# Patient Record
Sex: Female | Born: 1970
Health system: Southern US, Community
[De-identification: ages and names within clinical notes are randomized; demographics above are authoritative.]

## PROBLEM LIST (undated history)

## (undated) DIAGNOSIS — Z800 Family history of malignant neoplasm of digestive organs: Secondary | ICD-10-CM

## (undated) DIAGNOSIS — C801 Malignant (primary) neoplasm, unspecified: Secondary | ICD-10-CM

## (undated) DIAGNOSIS — F1910 Other psychoactive substance abuse, uncomplicated: Secondary | ICD-10-CM

## (undated) DIAGNOSIS — Z807 Family history of other malignant neoplasms of lymphoid, hematopoietic and related tissues: Secondary | ICD-10-CM

## (undated) DIAGNOSIS — F32A Depression, unspecified: Secondary | ICD-10-CM

## (undated) DIAGNOSIS — F329 Major depressive disorder, single episode, unspecified: Secondary | ICD-10-CM

## (undated) DIAGNOSIS — Z803 Family history of malignant neoplasm of breast: Secondary | ICD-10-CM

## (undated) HISTORY — DX: Other psychoactive substance abuse, uncomplicated: F19.10

## (undated) HISTORY — DX: Family history of malignant neoplasm of breast: Z80.3

## (undated) HISTORY — DX: Malignant (primary) neoplasm, unspecified: C80.1

## (undated) HISTORY — DX: Family history of other malignant neoplasms of lymphoid, hematopoietic and related tissues: Z80.7

## (undated) HISTORY — DX: Family history of malignant neoplasm of digestive organs: Z80.0

---

## 1998-10-10 ENCOUNTER — Other Ambulatory Visit: Admission: RE | Admit: 1998-10-10 | Discharge: 1998-10-10 | Payer: Self-pay | Admitting: *Deleted

## 1999-02-04 DIAGNOSIS — F1910 Other psychoactive substance abuse, uncomplicated: Secondary | ICD-10-CM

## 1999-02-04 HISTORY — DX: Other psychoactive substance abuse, uncomplicated: F19.10

## 1999-12-25 ENCOUNTER — Encounter: Admission: RE | Admit: 1999-12-25 | Discharge: 1999-12-25 | Payer: Self-pay | Admitting: Family Medicine

## 2001-01-08 ENCOUNTER — Emergency Department (HOSPITAL_COMMUNITY): Admission: EM | Admit: 2001-01-08 | Discharge: 2001-01-08 | Payer: Self-pay | Admitting: Emergency Medicine

## 2004-05-29 ENCOUNTER — Other Ambulatory Visit: Admission: RE | Admit: 2004-05-29 | Discharge: 2004-05-29 | Payer: Self-pay | Admitting: Family Medicine

## 2005-09-23 ENCOUNTER — Other Ambulatory Visit: Admission: RE | Admit: 2005-09-23 | Discharge: 2005-09-23 | Payer: Self-pay | Admitting: Family Medicine

## 2007-05-10 ENCOUNTER — Other Ambulatory Visit: Admission: RE | Admit: 2007-05-10 | Discharge: 2007-05-10 | Payer: Self-pay | Admitting: Family Medicine

## 2008-10-25 ENCOUNTER — Other Ambulatory Visit: Admission: RE | Admit: 2008-10-25 | Discharge: 2008-10-25 | Payer: Self-pay | Admitting: Family Medicine

## 2010-02-06 ENCOUNTER — Other Ambulatory Visit
Admission: RE | Admit: 2010-02-06 | Discharge: 2010-02-06 | Payer: Self-pay | Source: Home / Self Care | Admitting: Family Medicine

## 2011-02-04 HISTORY — PX: NEPHRECTOMY LIVING DONOR: SUR877

## 2011-03-13 ENCOUNTER — Other Ambulatory Visit: Payer: Self-pay | Admitting: *Deleted

## 2011-03-13 DIAGNOSIS — Z1231 Encounter for screening mammogram for malignant neoplasm of breast: Secondary | ICD-10-CM

## 2011-03-31 ENCOUNTER — Ambulatory Visit
Admission: RE | Admit: 2011-03-31 | Discharge: 2011-03-31 | Disposition: A | Discharge status: Home / Self Care | Payer: Self-pay | Source: Ambulatory Visit | Attending: *Deleted | Admitting: *Deleted

## 2011-03-31 DIAGNOSIS — Z1231 Encounter for screening mammogram for malignant neoplasm of breast: Secondary | ICD-10-CM

## 2011-04-02 ENCOUNTER — Other Ambulatory Visit: Payer: Self-pay | Admitting: Family Medicine

## 2011-04-02 ENCOUNTER — Other Ambulatory Visit (HOSPITAL_COMMUNITY)
Admission: RE | Admit: 2011-04-02 | Discharge: 2011-04-02 | Disposition: A | Discharge status: Home / Self Care | Payer: 59 | Source: Ambulatory Visit | Attending: Family Medicine | Admitting: Family Medicine

## 2011-04-02 DIAGNOSIS — Z01419 Encounter for gynecological examination (general) (routine) without abnormal findings: Secondary | ICD-10-CM | POA: Insufficient documentation

## 2011-04-08 ENCOUNTER — Other Ambulatory Visit: Payer: Self-pay | Admitting: Surgery

## 2011-04-08 DIAGNOSIS — Z1231 Encounter for screening mammogram for malignant neoplasm of breast: Secondary | ICD-10-CM

## 2011-04-09 ENCOUNTER — Other Ambulatory Visit: Payer: Self-pay | Admitting: Physician Assistant

## 2012-08-18 ENCOUNTER — Other Ambulatory Visit (HOSPITAL_COMMUNITY)
Admission: RE | Admit: 2012-08-18 | Discharge: 2012-08-18 | Disposition: A | Discharge status: Home / Self Care | Payer: 59 | Source: Ambulatory Visit | Attending: Family Medicine | Admitting: Family Medicine

## 2012-08-18 ENCOUNTER — Other Ambulatory Visit: Payer: Self-pay | Admitting: Family Medicine

## 2012-08-18 DIAGNOSIS — Z01419 Encounter for gynecological examination (general) (routine) without abnormal findings: Secondary | ICD-10-CM | POA: Insufficient documentation

## 2013-02-03 HISTORY — PX: NEPHRECTOMY: SHX65

## 2016-02-04 DIAGNOSIS — C8190 Hodgkin lymphoma, unspecified, unspecified site: Secondary | ICD-10-CM

## 2016-02-04 HISTORY — DX: Hodgkin lymphoma, unspecified, unspecified site: C81.90

## 2016-04-16 DIAGNOSIS — L918 Other hypertrophic disorders of the skin: Secondary | ICD-10-CM | POA: Diagnosis not present

## 2016-06-24 DIAGNOSIS — R221 Localized swelling, mass and lump, neck: Secondary | ICD-10-CM | POA: Diagnosis not present

## 2016-06-24 DIAGNOSIS — Z1231 Encounter for screening mammogram for malignant neoplasm of breast: Secondary | ICD-10-CM | POA: Diagnosis not present

## 2016-06-25 ENCOUNTER — Other Ambulatory Visit: Payer: Self-pay | Admitting: Family Medicine

## 2016-06-25 DIAGNOSIS — R221 Localized swelling, mass and lump, neck: Secondary | ICD-10-CM

## 2016-06-26 ENCOUNTER — Other Ambulatory Visit: Payer: Self-pay | Admitting: Family Medicine

## 2016-06-26 DIAGNOSIS — R221 Localized swelling, mass and lump, neck: Secondary | ICD-10-CM

## 2016-07-03 ENCOUNTER — Other Ambulatory Visit: Payer: Self-pay

## 2016-07-07 ENCOUNTER — Ambulatory Visit
Admission: RE | Admit: 2016-07-07 | Discharge: 2016-07-07 | Disposition: A | Discharge status: Home / Self Care | Payer: 59 | Source: Ambulatory Visit | Attending: Family Medicine | Admitting: Family Medicine

## 2016-07-07 DIAGNOSIS — R221 Localized swelling, mass and lump, neck: Secondary | ICD-10-CM

## 2016-07-07 DIAGNOSIS — Z0389 Encounter for observation for other suspected diseases and conditions ruled out: Secondary | ICD-10-CM | POA: Diagnosis not present

## 2016-07-14 ENCOUNTER — Other Ambulatory Visit (HOSPITAL_COMMUNITY): Payer: Self-pay | Admitting: Family Medicine

## 2016-07-14 DIAGNOSIS — R590 Localized enlarged lymph nodes: Secondary | ICD-10-CM

## 2016-07-16 ENCOUNTER — Other Ambulatory Visit: Payer: Self-pay | Admitting: Physician Assistant

## 2016-07-18 ENCOUNTER — Other Ambulatory Visit: Payer: Self-pay | Admitting: Radiology

## 2016-07-18 ENCOUNTER — Other Ambulatory Visit: Payer: Self-pay | Admitting: General Surgery

## 2016-07-18 ENCOUNTER — Ambulatory Visit (HOSPITAL_COMMUNITY): Payer: 59

## 2016-07-21 ENCOUNTER — Ambulatory Visit (HOSPITAL_COMMUNITY)
Admission: RE | Admit: 2016-07-21 | Discharge: 2016-07-21 | Disposition: A | Discharge status: Home / Self Care | Payer: 59 | Source: Ambulatory Visit | Attending: Family Medicine | Admitting: Family Medicine

## 2016-07-21 ENCOUNTER — Encounter (HOSPITAL_COMMUNITY): Payer: Self-pay

## 2016-07-21 DIAGNOSIS — C8170 Other classical Hodgkin lymphoma, unspecified site: Secondary | ICD-10-CM | POA: Diagnosis not present

## 2016-07-21 DIAGNOSIS — R590 Localized enlarged lymph nodes: Secondary | ICD-10-CM

## 2016-07-21 DIAGNOSIS — Z87891 Personal history of nicotine dependence: Secondary | ICD-10-CM | POA: Diagnosis not present

## 2016-07-21 DIAGNOSIS — C8190 Hodgkin lymphoma, unspecified, unspecified site: Secondary | ICD-10-CM | POA: Diagnosis not present

## 2016-07-21 DIAGNOSIS — F329 Major depressive disorder, single episode, unspecified: Secondary | ICD-10-CM | POA: Insufficient documentation

## 2016-07-21 HISTORY — DX: Major depressive disorder, single episode, unspecified: F32.9

## 2016-07-21 HISTORY — DX: Depression, unspecified: F32.A

## 2016-07-21 LAB — CBC
HCT: 38 % (ref 36.0–46.0)
HEMOGLOBIN: 12.3 g/dL (ref 12.0–15.0)
MCH: 31.5 pg (ref 26.0–34.0)
MCHC: 32.4 g/dL (ref 30.0–36.0)
MCV: 97.4 fL (ref 78.0–100.0)
Platelets: 300 10*3/uL (ref 150–400)
RBC: 3.9 MIL/uL (ref 3.87–5.11)
RDW: 12.9 % (ref 11.5–15.5)
WBC: 7.3 10*3/uL (ref 4.0–10.5)

## 2016-07-21 LAB — PREGNANCY, URINE: PREG TEST UR: NEGATIVE

## 2016-07-21 MED ORDER — FENTANYL CITRATE (PF) 100 MCG/2ML IJ SOLN
INTRAMUSCULAR | Status: AC
  Filled 2016-07-21: qty 2

## 2016-07-21 MED ORDER — FENTANYL CITRATE (PF) 100 MCG/2ML IJ SOLN
INTRAMUSCULAR | Status: AC | PRN
  Administered 2016-07-21: 50 ug via INTRAVENOUS
  Administered 2016-07-21: 25 ug via INTRAVENOUS

## 2016-07-21 MED ORDER — MIDAZOLAM HCL 2 MG/2ML IJ SOLN
INTRAMUSCULAR | Status: AC
  Filled 2016-07-21: qty 2

## 2016-07-21 MED ORDER — SODIUM CHLORIDE 0.9 % IV SOLN: INTRAVENOUS | Status: DC

## 2016-07-21 MED ORDER — SODIUM CHLORIDE 0.9 % IV SOLN
INTRAVENOUS | Status: AC | PRN
  Administered 2016-07-21: 10 mL/h via INTRAVENOUS

## 2016-07-21 MED ORDER — MIDAZOLAM HCL 2 MG/2ML IJ SOLN
INTRAMUSCULAR | Status: AC | PRN
  Administered 2016-07-21: 1 mg via INTRAVENOUS
  Administered 2016-07-21 (×2): 0.5 mg via INTRAVENOUS

## 2016-07-21 MED ORDER — LIDOCAINE HCL (PF) 1 % IJ SOLN
INTRAMUSCULAR | Status: AC
  Filled 2016-07-21: qty 30

## 2016-07-21 NOTE — Discharge Instructions (Addendum)
Moderate Conscious Sedation, Adult, Care After °These instructions provide you with information about caring for yourself after your procedure. Your health care provider may also give you more specific instructions. Your treatment has been planned according to current medical practices, but problems sometimes occur. Call your health care provider if you have any problems or questions after your procedure. °What can I expect after the procedure? °After your procedure, it is common: °· To feel sleepy for several hours. °· To feel clumsy and have poor balance for several hours. °· To have poor judgment for several hours. °· To vomit if you eat too soon. ° °Follow these instructions at home: °Needle Biopsy, Care After °These instructions give you information about caring for yourself after your procedure. Your doctor may also give you more specific instructions. Call your doctor if you have any problems or questions after your procedure. °Follow these instructions at home: °· Rest as told by your doctor. °· Take medicines only as told by your doctor. °· There are many different ways to close and cover the biopsy site, including stitches (sutures), skin glue, and adhesive strips. Follow instructions from your doctor about: °? How to take care of your biopsy site. °? When and how you should change your bandage (dressing). °? When you should remove your dressing. °? Removing whatever was used to close your biopsy site. °· Check your biopsy site every day for signs of infection. Watch for: °? Redness, swelling, or pain. °? Fluid, blood, or pus. °Contact a doctor if: °· You have a fever. °· You have redness, swelling, or pain at the biopsy site, and it lasts longer than a few days. °· You have fluid, blood, or pus coming from the biopsy site. °· You feel sick to your stomach (nauseous). °· You throw up (vomit). °Get help right away if: °· You are short of breath. °· You have trouble breathing. °· Your chest hurts. °· You  feel dizzy or you pass out (faint). °· You have bleeding that does not stop with pressure or a bandage. °· You cough up blood. °· Your belly (abdomen) hurts. °This information is not intended to replace advice given to you by your health care provider. Make sure you discuss any questions you have with your health care provider. °Document Released: 01/03/2008 Document Revised: 06/28/2015 Document Reviewed: 01/16/2014 °Elsevier Interactive Patient Education © 2018 Elsevier Inc. ° °For at least 24 hours after the procedure: ° °· Do not: °? Participate in activities where you could fall or become injured. °? Drive. °? Use heavy machinery. °? Drink alcohol. °? Take sleeping pills or medicines that cause drowsiness. °? Make important decisions or sign legal documents. °? Take care of children on your own. °· Rest. °Eating and drinking °· Follow the diet recommended by your health care provider. °· If you vomit: °? Drink water, juice, or soup when you can drink without vomiting. °? Make sure you have little or no nausea before eating solid foods. °General instructions °· Have a responsible adult stay with you until you are awake and alert. °· Take over-the-counter and prescription medicines only as told by your health care provider. °· If you smoke, do not smoke without supervision. °· Keep all follow-up visits as told by your health care provider. This is important. °Contact a health care provider if: °· You keep feeling nauseous or you keep vomiting. °· You feel light-headed. °· You develop a rash. °· You have a fever. °Get help right away if: °·   you discuss any questions you have with your health care provider. °Document Released: 11/10/2012 Document Revised: 06/25/2015 Document Reviewed: 05/12/2015 °Elsevier Interactive Patient Education © 2018 Elsevier Inc. ° ° °

## 2016-07-21 NOTE — Procedures (Signed)
US guided L neck LN Bx 18 g core times two EBL 0 Comp 0

## 2016-07-21 NOTE — H&P (Signed)
Chief Complaint: Patient was seen in consultation today for lymph node biopsy  Referring Physician(s): Sun,Vyvyan  Supervising Physician: Marybelle Killings  Patient Status: [facility] - Out-pt  History of Present Illness: LADAJAH Mccann is a 46 y.o. female with past medical history of depression who noted an enlarged lymph node on the left side of her neck.  IR consulted at the request of Dr. Donald Prose for a left neck lymph node biopsy.   Case was reviewed by Dr. Kathlene Cote who approves patient for procedure.   She presents for procedure today.  She has been NPO.  She does not take blood thinners.  She has been in her usual state of health.   Past Medical History:  Diagnosis Date  . Depression     Past Surgical History:  Procedure Laterality Date  . NEPHRECTOMY Left 2015    Allergies: Patient has no known allergies.  Medications: Prior to Admission medications   Medication Sig Start Date End Date Taking? Authorizing Provider  buPROPion (WELLBUTRIN XL) 300 MG 24 hr tablet Take 1 tablet by mouth daily. 02/15/10  Yes [provider]     History reviewed. No pertinent family history.  Social History   Social History  . Marital status: Married    Spouse name: N/A  . Number of children: N/A  . Years of education: N/A   Social History Main Topics  . Smoking status: Former Smoker    Types: Cigarettes  . Smokeless tobacco: Never Used     Comment: quit 17 yrs ago  . Alcohol use No  . Drug use: No  . Sexual activity: Not Asked   Other Topics Concern  . None   Social History Narrative  . None    Review of Systems  Constitutional: Negative for fatigue and fever.  Respiratory: Negative for cough and shortness of breath.   Cardiovascular: Negative for chest pain.  Psychiatric/Behavioral: Negative for behavioral problems and confusion.    Vital Signs: BP 119/73   Pulse 68   Temp 98.4 F (36.9 C) (Oral)   Resp 16   Ht 5\' 9"  (1.753 m)   Wt 205 lb (93 kg)    LMP 07/21/2016   SpO2 97%   BMI 30.27 kg/m   Physical Exam  Constitutional: She is oriented to person, place, and time. She appears well-developed.  Cardiovascular: Normal rate, regular rhythm and normal heart sounds.   Pulmonary/Chest: Effort normal and breath sounds normal.  Lymphadenopathy:    She has cervical adenopathy (palpable lymph node on the left neck).  Neurological: She is alert and oriented to person, place, and time.  Skin: Skin is warm and dry.  Psychiatric: She has a normal mood and affect. Her behavior is normal. Judgment and thought content normal.  Nursing note and vitals reviewed.   Mallampati Score:  MD Evaluation Airway: WNL Heart: WNL Abdomen: WNL Chest/ Lungs: WNL ASA  Classification: 3 Mallampati/Airway Score: Two  Imaging: US Soft Tissue Head And Neck  Result Date: 07/07/2016 CLINICAL DATA:  Palpable nodule left posterior neck/ supraclavicular region EXAM: ULTRASOUND OF HEAD/NECK SOFT TISSUES TECHNIQUE: Ultrasound examination of the head and neck soft tissues was performed in the area of clinical concern. COMPARISON:  None available FINDINGS: Small palpable nodule correlates with a superficial hypoechoic oval mass measuring 1.9 x 0.8 x 1.4 cm. No distinct fatty hilum. There is associated vascularity. Appearance is compatible with a superficial lymph node with loss of architecture. This is suspicious for a lymphoproliferative process. Consider  further evaluation with neck CT with contrast versus ultrasound biopsy. IMPRESSION: Left posterior neck palpable abnormality correlates with an abnormal superficial lymph node as above. See above comment and recommendation. Electronically Signed   By: Jerilynn Mages.  Shick M.D.   On: 07/07/2016 13:41    Labs:  CBC:  Recent Labs  07/21/16 0615  WBC 7.3  HGB 12.3  HCT 38.0  PLT 300    COAGS: No results for input(s): INR, APTT in the last 8760 hours.  BMP: No results for input(s): NA, K, CL, CO2, GLUCOSE, BUN,  CALCIUM, CREATININE, GFRNONAA, GFRAA in the last 8760 hours.  Invalid input(s): CMP  LIVER FUNCTION TESTS: No results for input(s): BILITOT, AST, ALT, ALKPHOS, PROT, ALBUMIN in the last 8760 hours.  TUMOR MARKERS: No results for input(s): AFPTM, CEA, CA199, CHROMGRNA in the last 8760 hours.  Assessment and Plan: Patient with past medical history of depression presents with enlarged left neck lymph node.  Request is made for biopsy by Dr. Donald Prose.  Patient has been approved for procedure and presents to department today in her usual state of health.  She has been NPO.  She does not take blood thinners.  Risks and benefits discussed with the patient including, but not limited to bleeding, infection, damage to adjacent structures or low yield requiring additional tests. All of the patient's questions were answered, patient is agreeable to proceed. Consent signed and in chart.  Thank you for this interesting consult.  I greatly enjoyed meeting Rhonda Mccann and look forward to participating in their care.  A copy of this report was sent to the requesting provider on this date.  Electronically Signed: Docia Barrier, PA 07/21/2016, 8:02 AM   I spent a total of  30 Minutes   in face to face in clinical consultation, greater than 50% of which was counseling/coordinating care for enlarged lymph node

## 2016-07-29 ENCOUNTER — Ambulatory Visit (HOSPITAL_BASED_OUTPATIENT_CLINIC_OR_DEPARTMENT_OTHER): Payer: 59

## 2016-07-29 ENCOUNTER — Encounter: Payer: Self-pay | Admitting: Hematology

## 2016-07-29 ENCOUNTER — Ambulatory Visit (HOSPITAL_BASED_OUTPATIENT_CLINIC_OR_DEPARTMENT_OTHER): Payer: 59 | Admitting: Hematology

## 2016-07-29 ENCOUNTER — Telehealth: Payer: Self-pay | Admitting: Hematology

## 2016-07-29 VITALS — BP 124/74 | HR 72 | Temp 98.3°F | Resp 18 | Ht 69.0 in | Wt 209.9 lb

## 2016-07-29 DIAGNOSIS — C8110 Nodular sclerosis classical Hodgkin lymphoma, unspecified site: Secondary | ICD-10-CM | POA: Diagnosis not present

## 2016-07-29 DIAGNOSIS — Z905 Acquired absence of kidney: Secondary | ICD-10-CM | POA: Diagnosis not present

## 2016-07-29 LAB — CBC & DIFF AND RETIC
BASO%: 0.2 % (ref 0.0–2.0)
Basophils Absolute: 0 10*3/uL (ref 0.0–0.1)
EOS ABS: 0.1 10*3/uL (ref 0.0–0.5)
EOS%: 1.1 % (ref 0.0–7.0)
HCT: 41.8 % (ref 34.8–46.6)
HGB: 13.7 g/dL (ref 11.6–15.9)
Immature Retic Fract: 4.5 % (ref 1.60–10.00)
LYMPH#: 1.9 10*3/uL (ref 0.9–3.3)
LYMPH%: 22.9 % (ref 14.0–49.7)
MCH: 31.9 pg (ref 25.1–34.0)
MCHC: 32.8 g/dL (ref 31.5–36.0)
MCV: 97.4 fL (ref 79.5–101.0)
MONO#: 0.4 10*3/uL (ref 0.1–0.9)
MONO%: 5.2 % (ref 0.0–14.0)
NEUT#: 5.8 10*3/uL (ref 1.5–6.5)
NEUT%: 70.6 % (ref 38.4–76.8)
Platelets: 338 10*3/uL (ref 145–400)
RBC: 4.29 10*6/uL (ref 3.70–5.45)
RDW: 12.7 % (ref 11.2–14.5)
RETIC %: 1.94 % (ref 0.70–2.10)
RETIC CT ABS: 83.23 10*3/uL (ref 33.70–90.70)
WBC: 8.2 10*3/uL (ref 3.9–10.3)

## 2016-07-29 LAB — COMPREHENSIVE METABOLIC PANEL
ALT: 21 U/L (ref 0–55)
AST: 14 U/L (ref 5–34)
Albumin: 4.2 g/dL (ref 3.5–5.0)
Alkaline Phosphatase: 105 U/L (ref 40–150)
Anion Gap: 12 mEq/L — ABNORMAL HIGH (ref 3–11)
BUN: 8.6 mg/dL (ref 7.0–26.0)
CALCIUM: 9.7 mg/dL (ref 8.4–10.4)
CHLORIDE: 105 meq/L (ref 98–109)
CO2: 22 mEq/L (ref 22–29)
CREATININE: 1.1 mg/dL (ref 0.6–1.1)
EGFR: 60 mL/min/{1.73_m2} — ABNORMAL LOW (ref >90)
GLUCOSE: 88 mg/dL (ref 70–140)
Potassium: 3.6 mEq/L (ref 3.5–5.1)
SODIUM: 140 meq/L (ref 136–145)
Total Bilirubin: 0.38 mg/dL (ref 0.20–1.20)
Total Protein: 8.1 g/dL (ref 6.4–8.3)

## 2016-07-29 LAB — LACTATE DEHYDROGENASE: LDH: 166 U/L (ref 125–245)

## 2016-07-29 NOTE — Telephone Encounter (Signed)
Spoke with patient re new patient appointment with Dr. Irene Limbo for today at 12 noon. Patient will arrive 15-30 minutes early for check in.

## 2016-07-29 NOTE — Progress Notes (Signed)
[facility]    HEMATOLOGY/ONCOLOGY CONSULTATION NOTE  Date of Service: 07/29/2016  Patient Care Team: Donald Prose, MD as PCP - General (Family Medicine)  CHIEF COMPLAINTS/PURPOSE OF CONSULTATION:  Newly diagnosed Hodgkins lymphoma  HISTORY OF PRESENTING ILLNESS:   Rhonda Mccann is a wonderful 46 y.o. female who has been referred to [place] by Dr .Donald Prose, MD for evaluation and management of newly diagnosed Hodgkins Lymphoma.  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  Patient first reported noticing a lump in the left side of her neck in January 2018. She noted some mild progressive increase in this lump which appeared more prominent and she reported it to her primary care physician. Patient had an ultrasound of her neck on 07/07/2016 which showed a 1.9 x 0.8 x 1.4 cm lymph node with no distinct fatty hilum. This was subsequently biopsied under ultrasound guidance on 07/21/2016 and findings are consistent with classical Hodgkin's lymphoma nodular sclerosis subtype.  Patient notes no other overtly palpable lymphadenopathy noted. No fevers no chills no night sweats no unexpected weight loss. She reports no lethargy no anorexia. Overall feels quite well.  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   MEDICAL HISTORY:  Past Medical History:  Diagnosis Date  . Depression   EX alcohol abuse sober for 17 years  Donated left kidney in 2013 (single kidney) Scoliosis GERD  SURGICAL HISTORY: Past Surgical History:  Procedure Laterality Date  . NEPHRECTOMY Left 2015  Breast Augmentation Surgery in 1994 (saline implants)  SOCIAL HISTORY: Social History   Social History  . Marital status: Married    Spouse name: N/A  . Number of children: N/A  . Years of education:  N/A   Occupational History  . Not on file.   Social History Main Topics  . Smoking status: Former Smoker    Types: Cigarettes  . Smokeless tobacco: Never Used     Comment: quit 17 yrs ago  . Alcohol use No  . Drug use: No  . Sexual activity: Not on file   Other Topics Concern  . Not on file   Social History Narrative  . No narrative on file  Works as a Production manager   FAMILY HISTORY: Paternal aunt had breast cancer Paternal grandfather had lymphoma  ALLERGIES:  has No Known Allergies.  MEDICATIONS:  Current Outpatient Prescriptions  Medication Sig Dispense Refill  . buPROPion (WELLBUTRIN XL) 300 MG 24 hr tablet Take 1 tablet by mouth daily.    Marland Kitchen ibuprofen (ADVIL,MOTRIN) 800 MG tablet Take 800 mg by mouth every 8 (eight) hours as needed.    . lidocaine-prilocaine (EMLA) cream Apply 1 application topically as needed. 30 g 0  . ondansetron (ZOFRAN) 8 MG tablet Take 1 tablet (8 mg total) by mouth every 8 (eight) hours as needed for nausea or vomiting. 20 tablet 0  . prochlorperazine (COMPAZINE) 10 MG tablet Take 1 tablet (10 mg total) by mouth every 6 (six) hours as needed for nausea or vomiting. 30 tablet 0   No current facility-administered medications for this visit.     REVIEW OF SYSTEMS:    10 Point review of Systems was done is negative except as noted above.  PHYSICAL EXAMINATION: ECOG PERFORMANCE STATUS: 0 - Asymptomatic  . Vitals:   07/29/16 1221  BP: 124/74  Pulse: 72  Resp: 18  Temp: 98.3 F (36.8 C)   Filed Weights   07/29/16 1221  Weight: 209 lb 14.4 oz (95.2 kg)   .Body mass index is 31 kg/m.  GENERAL:alert, in no acute distress and comfortable SKIN: no acute rashes, no significant lesions EYES: conjunctiva are pink and non-injected, sclera anicteric OROPHARYNX: MMM, no exudates, no oropharyngeal erythema or ulceration NECK: supple, no JVD LYMPH:  no palpable lymphadenopathy in the cervical, axillary or inguinal regions LUNGS: clear to  auscultation b/l with normal respiratory effort HEART: regular rate & rhythm ABDOMEN:  normoactive bowel sounds , non tender, not distended. Extremity: no pedal edema PSYCH: alert & oriented x 3 with fluent speech NEURO: no focal motor/sensory deficits  LABORATORY DATA:  I have reviewed the data as listed  . CBC Latest Ref Rng & Units 07/21/2016  WBC 4.0 - 10.5 K/uL 7.3  Hemoglobin 12.0 - 15.0 g/dL 12.3  Hematocrit 36.0 - 46.0 % 38.0  Platelets 150 - 400 K/uL 300   Component     Latest Ref Rng & Units 07/29/2016  WBC     3.9 - 10.3 10e3/uL 8.2  NEUT#     1.5 - 6.5 10e3/uL 5.8  Hemoglobin     11.6 - 15.9 g/dL 13.7  HCT     34.8 - 46.6 % 41.8  Platelets     145 - 400 10e3/uL 338  MCV     79.5 - 101.0 fL 97.4  MCH     25.1 - 34.0 pg 31.9  MCHC     31.5 - 36.0 g/dL 32.8  RBC     3.70 - 5.45 10e6/uL 4.29  RDW     11.2 - 14.5 % 12.7  lymph#     0.9 - 3.3 10e3/uL 1.9  MONO#     0.1 - 0.9 10e3/uL 0.4  Eosinophils Absolute     0.0 - 0.5 10e3/uL 0.1  Basophils Absolute     0.0 - 0.1 10e3/uL 0.0  NEUT%     38.4 - 76.8 % 70.6  LYMPH%     14.0 - 49.7 % 22.9  MONO%     0.0 - 14.0 % 5.2  EOS%     0.0 - 7.0 % 1.1  BASO%     0.0 - 2.0 % 0.2  Retic %     0.70 - 2.10 % 1.94  Retic Ct Abs     33.70 - 90.70 10e3/uL 83.23  Immature Retic Fract     1.60 - 10.00 % 4.50  Sodium     136 - 145 mEq/L 140  Potassium     3.5 - 5.1 mEq/L 3.6  Chloride     98 - 109 mEq/L 105  CO2     22 - 29 mEq/L 22  Glucose     70 - 140 mg/dl 88  BUN     7.0 - 26.0 mg/dL 8.6  Creatinine     0.6 - 1.1 mg/dL 1.1  Total Bilirubin     0.20 - 1.20 mg/dL 0.38  Alkaline Phosphatase     40 - 150 U/L 105  AST     5 - 34 U/L 14  ALT     0 - 55 U/L 21  Total Protein     6.4 - 8.3 g/dL 8.1  Albumin     3.5 - 5.0 g/dL 4.2  Calcium     8.4 - 10.4 mg/dL 9.7  Anion gap  3 - 11 mEq/L 12 (H)  EGFR     >90 ml/min/1.73 m2 60 (L)  Sed Rate     0 - 32 mm/hr 18  LDH     125 - 245 U/L 166   HIV Screen 4th Generation wRfx     Non Reactive Non Reactive  Hep C Virus Ab     0.0 - 0.9 s/co ratio <0.1  Hepatitis B Surface Ag     Negative Negative  Hep B Core Ab, Tot     Negative Negative      RADIOGRAPHIC STUDIES: I have personally reviewed the radiological images as listed and agreed with the findings in the report. US Soft Tissue Head And Neck  Result Date: 07/07/2016 CLINICAL DATA:  Palpable nodule left posterior neck/ supraclavicular region EXAM: ULTRASOUND OF HEAD/NECK SOFT TISSUES TECHNIQUE: Ultrasound examination of the head and neck soft tissues was performed in the area of clinical concern. COMPARISON:  None available FINDINGS: Small palpable nodule correlates with a superficial hypoechoic oval mass measuring 1.9 x 0.8 x 1.4 cm. No distinct fatty hilum. There is associated vascularity. Appearance is compatible with a superficial lymph node with loss of architecture. This is suspicious for a lymphoproliferative process. Consider further evaluation with neck CT with contrast versus ultrasound biopsy. IMPRESSION: Left posterior neck palpable abnormality correlates with an abnormal superficial lymph node as above. See above comment and recommendation. Electronically Signed   By: Jerilynn Mages.  Shick M.D.   On: 07/07/2016 13:41   US Biopsy  Result Date: 07/21/2016 INDICATION: Enlarged left neck lymph node EXAM: ULTRASOUND-GUIDED BIOPSY LEFT NECK LYMPH NODE.  CORE. MEDICATIONS: None. ANESTHESIA/SEDATION: Fentanyl 75 mcg IV; Versed 2 mg IV Moderate Sedation Time:  10 The patient was continuously monitored during the procedure by the interventional radiology nurse under my direct supervision. FLUOROSCOPY TIME:  None COMPLICATIONS: None immediate. PROCEDURE: Informed written consent was obtained from the patient after a thorough discussion of the procedural risks, benefits and alternatives. All questions were addressed. Maximal Sterile Barrier Technique was utilized including caps, mask, sterile  gowns, sterile gloves, sterile drape, hand hygiene and skin antiseptic. A timeout was performed prior to the initiation of the procedure. The left neck was prepped with ChloraPrep in a sterile fashion, and a sterile drape was applied covering the operative field. A sterile gown and sterile gloves were used for the procedure. Under sonographic guidance, 2 18 gauge core biopsies of the left neck lymph node were obtained. Final imaging was performed. Patient tolerated the procedure well without complication. Vital sign monitoring by nursing staff during the procedure will continue as patient is in the special procedures unit for post procedure observation. FINDINGS: The images document guide needle placement within the left neck lymph node. Post biopsy images demonstrate no hemorrhage. IMPRESSION: Successful ultrasound-guided core biopsy of a left neck lymph node. Electronically Signed   By: Marybelle Killings M.D.   On: 07/21/2016 09:51    ASSESSMENT & PLAN:   46 yo very pleasant caucasian female with   1) Newly diagnosed Classical Hodgkins Lymphoma -Nodular Sclerosis Subtype No constitutional symptoms. Unknown stage at this time. Sed rate 18 (WNL) Plan -Continue diagnosis of classical Hodgkin lymphoma, workup, prognosis, treatment rationale and outcomes were discussed in detail with the patient . -She had large number of questions which were discussed in details -We discussed the plan of treatment and the fact that the specifics will be determined by staging  Labs today PET/CT in 5-7 days ECHO in 5-7 days PFT's in  5-7 days Port-a-cath placement in 7-10 days Chemo-counseling in 7-10 days for ABVD RTC with Dr Irene Limbo with labs on 08/19/2016 We shall plan to start ABVD 08/19/2016. We discussed that the total number of cycles and specific will depending on staging.  2) Single kidney - patient donated left kidney in 2013 -avoid nephrotoxic medications -will get 24h urine creatinine clearance for accurate  assessment of residual renal function from chemotherapy dosing standpoint.  All of the patients questions were answered with apparent satisfaction. The patient knows to call the clinic with any problems, questions or concerns.  I spent 60 minutes counseling the patient face to face. The total time spent in the appointment was 80 minutes and more than 50% was on counseling and direct patient cares.    Sullivan Lone MD [place] AAHIVMS [facility] Hematology/Oncology Physician [facility]  (Office):       [phone] (Work cell):  [phone] (Fax):           [phone]  07/29/2016 12:27 PM

## 2016-07-29 NOTE — Patient Instructions (Signed)
Thank you for choosing [facility] to provide your oncology and hematology care.  To afford each patient quality time with our providers, please arrive 30 minutes before your scheduled appointment time.  If you arrive late for your appointment, you may be asked to reschedule.  We strive to give you quality time with our providers, and arriving late affects you and other patients whose appointments are after yours.  If you are a no show for multiple scheduled visits, you may be dismissed from the clinic at the providers discretion.   Again, thank you for choosing [facility], our hope is that these requests will decrease the amount of time that you wait before being seen by our physicians.  ______________________________________________________________________ Should you have questions after your visit to the [facility], please contact our office at [phone] between the hours of 8:30 and 4:30 p.m.    Voicemails left after 4:30p.m will not be returned until the following business day.   For prescription refill requests, please have your pharmacy contact us directly.  Please also try to allow 48 hours for prescription requests.   Please contact the scheduling department for questions regarding scheduling.  For scheduling of procedures such as PET scans, CT scans, MRI, Ultrasound, etc please contact central scheduling at [phone].   Resources For Cancer Patients and Caregivers:  American Cancer Society:  800-227-2345  Can help patients locate various types of support and financial assistance Cancer Care: 1-800-813-HOPE (4673) Provides financial assistance, online support groups, medication/co-pay assistance.   [place] DSS:  336-641-3447 Where to apply for food stamps, Medicaid, and utility assistance Medicare Rights Center: 800-333-4114 Helps people with Medicare understand their rights and benefits, navigate the Medicare system, and secure the  quality healthcare they deserve SCAT: 336-333-6589 [place] [employer]'s shared-ride transportation service for eligible riders who have a disability that prevents them from riding the fixed route bus.   For additional information on assistance programs please contact our social worker:   Grier Hock/Abigail Elmore:  [phone] 

## 2016-07-29 NOTE — Telephone Encounter (Signed)
Scheduled appt per 6/26 los Gave patient AVS and calender per los. Central radiology to contact patient with pet and echo appt.

## 2016-07-30 ENCOUNTER — Encounter: Payer: Self-pay | Admitting: *Deleted

## 2016-07-30 LAB — SEDIMENTATION RATE: Sedimentation Rate-Westergren: 18 mm/hr (ref 0–32)

## 2016-07-30 LAB — HEPATITIS C ANTIBODY: Hep C Virus Ab: <0.1 s/co ratio (ref 0.0–0.9)

## 2016-07-30 LAB — HIV ANTIBODY (ROUTINE TESTING W REFLEX): HIV Screen 4th Generation wRfx: NONREACTIVE

## 2016-07-30 LAB — HEPATITIS B SURFACE ANTIGEN: HBsAg Screen: NEGATIVE

## 2016-07-30 LAB — HEPATITIS B CORE ANTIBODY, TOTAL: HEP B C TOTAL AB: NEGATIVE

## 2016-07-31 ENCOUNTER — Other Ambulatory Visit: Payer: 59

## 2016-07-31 ENCOUNTER — Encounter: Payer: Self-pay | Admitting: *Deleted

## 2016-08-04 ENCOUNTER — Other Ambulatory Visit: Payer: Self-pay | Admitting: Radiology

## 2016-08-04 ENCOUNTER — Ambulatory Visit (HOSPITAL_COMMUNITY)
Admission: RE | Admit: 2016-08-04 | Discharge: 2016-08-04 | Disposition: A | Discharge status: Home / Self Care | Payer: 59 | Source: Ambulatory Visit | Attending: Hematology | Admitting: Hematology

## 2016-08-04 DIAGNOSIS — C8110 Nodular sclerosis classical Hodgkin lymphoma, unspecified site: Secondary | ICD-10-CM | POA: Insufficient documentation

## 2016-08-04 LAB — PULMONARY FUNCTION TEST
DL/VA % PRED: 79 %
DL/VA: 4.22 ml/min/mmHg/L
DLCO UNC % PRED: 70 %
DLCO UNC: 21.88 ml/min/mmHg
FEF 25-75 Pre: 4.32 L/sec
FEF2575-%Pred-Pre: 133 %
FEV1-%Pred-Pre: 98 %
FEV1-Pre: 3.36 L
FEV1FVC-%PRED-PRE: 107 %
FEV6-%PRED-PRE: 92 %
FEV6-PRE: 3.82 L
FEV6FVC-%Pred-Pre: 102 %
FVC-%Pred-Pre: 90 %
FVC-Pre: 3.86 L
PRE FEV1/FVC RATIO: 87 %
PRE FEV6/FVC RATIO: 100 %
RV % PRED: 71 %
RV: 1.39 L
TLC % PRED: 91 %
TLC: 5.31 L

## 2016-08-05 ENCOUNTER — Ambulatory Visit: Payer: 59

## 2016-08-05 ENCOUNTER — Ambulatory Visit (HOSPITAL_COMMUNITY)
Admission: RE | Admit: 2016-08-05 | Discharge: 2016-08-05 | Disposition: A | Discharge status: Home / Self Care | Payer: 59 | Source: Ambulatory Visit | Attending: Hematology | Admitting: Hematology

## 2016-08-05 ENCOUNTER — Encounter (HOSPITAL_COMMUNITY): Payer: Self-pay

## 2016-08-05 ENCOUNTER — Other Ambulatory Visit: Payer: Self-pay | Admitting: Hematology

## 2016-08-05 DIAGNOSIS — C8191 Hodgkin lymphoma, unspecified, lymph nodes of head, face, and neck: Secondary | ICD-10-CM | POA: Diagnosis present

## 2016-08-05 DIAGNOSIS — C8110 Nodular sclerosis classical Hodgkin lymphoma, unspecified site: Secondary | ICD-10-CM

## 2016-08-05 DIAGNOSIS — Z905 Acquired absence of kidney: Secondary | ICD-10-CM | POA: Insufficient documentation

## 2016-08-05 DIAGNOSIS — F329 Major depressive disorder, single episode, unspecified: Secondary | ICD-10-CM | POA: Diagnosis not present

## 2016-08-05 DIAGNOSIS — C8190 Hodgkin lymphoma, unspecified, unspecified site: Secondary | ICD-10-CM | POA: Diagnosis not present

## 2016-08-05 DIAGNOSIS — Z87891 Personal history of nicotine dependence: Secondary | ICD-10-CM | POA: Diagnosis not present

## 2016-08-05 DIAGNOSIS — Z452 Encounter for adjustment and management of vascular access device: Secondary | ICD-10-CM | POA: Diagnosis not present

## 2016-08-05 HISTORY — PX: IR US GUIDE VASC ACCESS RIGHT: IMG2390

## 2016-08-05 HISTORY — PX: IR FLUORO GUIDE PORT INSERTION RIGHT: IMG5741

## 2016-08-05 LAB — PROTIME-INR
INR: 0.96
Prothrombin Time: 12.8 seconds (ref 11.4–15.2)

## 2016-08-05 LAB — CBC WITH DIFFERENTIAL/PLATELET
BASOS ABS: 0 10*3/uL (ref 0.0–0.1)
BASOS PCT: 0 %
Eosinophils Absolute: 0.1 10*3/uL (ref 0.0–0.7)
Eosinophils Relative: 1 %
HEMATOCRIT: 38.6 % (ref 36.0–46.0)
HEMOGLOBIN: 13.1 g/dL (ref 12.0–15.0)
Lymphocytes Relative: 21 %
Lymphs Abs: 1.7 10*3/uL (ref 0.7–4.0)
MCH: 31.5 pg (ref 26.0–34.0)
MCHC: 33.9 g/dL (ref 30.0–36.0)
MCV: 92.8 fL (ref 78.0–100.0)
MONOS PCT: 7 %
Monocytes Absolute: 0.6 10*3/uL (ref 0.1–1.0)
NEUTROS ABS: 6 10*3/uL (ref 1.7–7.7)
NEUTROS PCT: 71 %
Platelets: 329 10*3/uL (ref 150–400)
RBC: 4.16 MIL/uL (ref 3.87–5.11)
RDW: 12.5 % (ref 11.5–15.5)
WBC: 8.3 10*3/uL (ref 4.0–10.5)

## 2016-08-05 MED ORDER — FENTANYL CITRATE (PF) 100 MCG/2ML IJ SOLN
INTRAMUSCULAR | Status: AC
  Filled 2016-08-05: qty 4

## 2016-08-05 MED ORDER — FENTANYL CITRATE (PF) 100 MCG/2ML IJ SOLN
INTRAMUSCULAR | Status: AC | PRN
  Administered 2016-08-05 (×2): 50 ug via INTRAVENOUS

## 2016-08-05 MED ORDER — LIDOCAINE HCL 1 % IJ SOLN
INTRAMUSCULAR | Status: AC | PRN
  Administered 2016-08-05: 10 mL

## 2016-08-05 MED ORDER — CEFAZOLIN SODIUM-DEXTROSE 2-4 GM/100ML-% IV SOLN
INTRAVENOUS | Status: AC
  Administered 2016-08-05: 2 g via INTRAVENOUS
  Filled 2016-08-05: qty 100

## 2016-08-05 MED ORDER — SODIUM CHLORIDE 0.9 % IV SOLN
INTRAVENOUS | Status: DC
  Administered 2016-08-05: 10:00:00 via INTRAVENOUS

## 2016-08-05 MED ORDER — LIDOCAINE-EPINEPHRINE (PF) 2 %-1:200000 IJ SOLN
INTRAMUSCULAR | Status: AC | PRN
  Administered 2016-08-05: 10 mL

## 2016-08-05 MED ORDER — MIDAZOLAM HCL 2 MG/2ML IJ SOLN
INTRAMUSCULAR | Status: AC
  Filled 2016-08-05: qty 4

## 2016-08-05 MED ORDER — CEFAZOLIN SODIUM-DEXTROSE 2-4 GM/100ML-% IV SOLN
2.0000 g | INTRAVENOUS | Status: AC
  Administered 2016-08-05: 2 g via INTRAVENOUS

## 2016-08-05 MED ORDER — LIDOCAINE HCL 1 % IJ SOLN
INTRAMUSCULAR | Status: AC
  Filled 2016-08-05: qty 20

## 2016-08-05 MED ORDER — MIDAZOLAM HCL 2 MG/2ML IJ SOLN
INTRAMUSCULAR | Status: AC | PRN
  Administered 2016-08-05 (×4): 1 mg via INTRAVENOUS

## 2016-08-05 MED ORDER — LIDOCAINE-EPINEPHRINE (PF) 2 %-1:200000 IJ SOLN
INTRAMUSCULAR | Status: AC
  Filled 2016-08-05: qty 20

## 2016-08-05 MED ORDER — HEPARIN SOD (PORK) LOCK FLUSH 100 UNIT/ML IV SOLN
INTRAVENOUS | Status: AC | PRN
  Administered 2016-08-05: 500 [IU] via INTRAVENOUS

## 2016-08-05 MED ORDER — HEPARIN SOD (PORK) LOCK FLUSH 100 UNIT/ML IV SOLN
INTRAVENOUS | Status: AC
Start: 2016-08-05 — End: 2016-08-05
  Filled 2016-08-05: qty 5

## 2016-08-05 NOTE — Discharge Instructions (Signed)
Moderate Conscious Sedation, Adult, Care After °These instructions provide you with information about caring for yourself after your procedure. Your health care provider may also give you more specific instructions. Your treatment has been planned according to current medical practices, but problems sometimes occur. Call your health care provider if you have any problems or questions after your procedure. °What can I expect after the procedure? °After your procedure, it is common: °· To feel sleepy for several hours. °· To feel clumsy and have poor balance for several hours. °· To have poor judgment for several hours. °· To vomit if you eat too soon. ° °Follow these instructions at home: °For at least 24 hours after the procedure: ° °· Do not: °? Participate in activities where you could fall or become injured. °? Drive. °? Use heavy machinery. °? Drink alcohol. °? Take sleeping pills or medicines that cause drowsiness. °? Make important decisions or sign legal documents. °? Take care of children on your own. °· Rest. °Eating and drinking °· Follow the diet recommended by your health care provider. °· If you vomit: °? Drink water, juice, or soup when you can drink without vomiting. °? Make sure you have little or no nausea before eating solid foods. °General instructions °· Have a responsible adult stay with you until you are awake and alert. °· Take over-the-counter and prescription medicines only as told by your health care provider. °· If you smoke, do not smoke without supervision. °· Keep all follow-up visits as told by your health care provider. This is important. °Contact a health care provider if: °· You keep feeling nauseous or you keep vomiting. °· You feel light-headed. °· You develop a rash. °· You have a fever. °Get help right away if: °· You have trouble breathing. °This information is not intended to replace advice given to you by your health care provider. Make sure you discuss any questions you have  with your health care provider. °Document Released: 11/10/2012 Document Revised: 06/25/2015 Document Reviewed: 05/12/2015 °Elsevier Interactive Patient Education © 2018 [employer] ° ° °Implanted Port Home Guide °An implanted port is a type of central line that is placed under the skin. Central lines are used to provide IV access when treatment or nutrition needs to be given through a person’s veins. Implanted ports are used for long-term IV access. An implanted port may be placed because: °· You need IV medicine that would be irritating to the small veins in your hands or arms. °· You need long-term IV medicines, such as antibiotics. °· You need IV nutrition for a long period. °· You need frequent blood draws for lab tests. °· You need dialysis. ° °Implanted ports are usually placed in the chest area, but they can also be placed in the upper arm, the abdomen, or the leg. An implanted port has two main parts: °· Reservoir. The reservoir is round and will appear as a small, raised area under your skin. The reservoir is the part where a needle is inserted to give medicines or draw blood. °· Catheter. The catheter is a thin, flexible tube that extends from the reservoir. The catheter is placed into a large vein. Medicine that is inserted into the reservoir goes into the catheter and then into the vein. ° °How will I care for my incision site? °Do not get the incision site wet. Bathe or shower as directed by your health care provider. °How is my port accessed? °Special steps must be taken to access the   port: °· Before the port is accessed, a numbing cream can be placed on the skin. This helps numb the skin over the port site. °· Your health care provider uses a sterile technique to access the port. °? Your health care provider must put on a mask and sterile gloves. °? The skin over your port is cleaned carefully with an antiseptic and allowed to dry. °? The port is gently pinched between sterile gloves, and a needle  is inserted into the port. °· Only "non-coring" port needles should be used to access the port. Once the port is accessed, a blood return should be checked. This helps ensure that the port is in the vein and is not clogged. °· If your port needs to remain accessed for a constant infusion, a clear (transparent) bandage will be placed over the needle site. The bandage and needle will need to be changed every week, or as directed by your health care provider. °· Keep the bandage covering the needle clean and dry. Do not get it wet. Follow your health care provider’s instructions on how to take a shower or bath while the port is accessed. °· If your port does not need to stay accessed, no bandage is needed over the port. ° °What is flushing? °Flushing helps keep the port from getting clogged. Follow your health care provider’s instructions on how and when to flush the port. Ports are usually flushed with saline solution or a medicine called heparin. The need for flushing will depend on how the port is used. °· If the port is used for intermittent medicines or blood draws, the port will need to be flushed: °? After medicines have been given. °? After blood has been drawn. °? As part of routine maintenance. °· If a constant infusion is running, the port may not need to be flushed. ° °How long will my port stay implanted? °The port can stay in for as long as your health care provider thinks it is needed. When it is time for the port to come out, surgery will be done to remove it. The procedure is similar to the one performed when the port was put in. °When should I seek immediate medical care? °When you have an implanted port, you should seek immediate medical care if: °· You notice a bad smell coming from the incision site. °· You have swelling, redness, or drainage at the incision site. °· You have more swelling or pain at the port site or the surrounding area. °· You have a fever that is not controlled with  medicine. ° °This information is not intended to replace advice given to you by your health care provider. Make sure you discuss any questions you have with your health care provider. °Document Released: 01/20/2005 Document Revised: 06/28/2015 Document Reviewed: 09/27/2012 °Elsevier Interactive Patient Education © 2017 [employer] ° ° °Implanted Port Insertion, Care After °This sheet gives you information about how to care for yourself after your procedure. Your health care provider may also give you more specific instructions. If you have problems or questions, contact your health care provider. °What can I expect after the procedure? °After your procedure, it is common to have: °· Discomfort at the port insertion site. °· Bruising on the skin over the port. This should improve over 3-4 days. ° °Follow these instructions at home: °Port care °· After your port is placed, you will get a manufacturer's information card. The card has information about your port. Keep this card with   you at all times. °· Take care of the port as told by your health care provider. Ask your health care provider if you or a family member can get training for taking care of the port at home. A home health care nurse may also take care of the port. °· Make sure to remember what type of port you have. °Incision care °· Follow instructions from your health care provider about how to take care of your port insertion site. Make sure you: °? Wash your hands with soap and water before you change your bandage (dressing). If soap and water are not available, use hand sanitizer. °? Change your dressing as told by your health care provider. °? Leave stitches (sutures), skin glue, or adhesive strips in place. These skin closures may need to stay in place for 2 weeks or longer. If adhesive strip edges start to loosen and curl up, you may trim the loose edges. Do not remove adhesive strips completely unless your health care provider tells you to do  that. °· Check your port insertion site every day for signs of infection. Check for: °? More redness, swelling, or pain. °? More fluid or blood. °? Warmth. °? Pus or a bad smell. °General instructions °· Do not take baths, swim, or use a hot tub until your health care provider approves. °· Do not lift anything that is heavier than 10 lb (4.5 kg) for a week, or as told by your health care provider. °· Ask your health care provider when it is okay to: °? Return to work or school. °? Resume usual physical activities or sports. °· Do not drive for 24 hours if you were given a medicine to help you relax (sedative). °· Take over-the-counter and prescription medicines only as told by your health care provider. °· Wear a medical alert bracelet in case of an emergency. This will tell any health care providers that you have a port. °· Keep all follow-up visits as told by your health care provider. This is important. °Contact a health care provider if: °· You cannot flush your port with saline as directed, or you cannot draw blood from the port. °· You have a fever or chills. °· You have more redness, swelling, or pain around your port insertion site. °· You have more fluid or blood coming from your port insertion site. °· Your port insertion site feels warm to the touch. °· You have pus or a bad smell coming from the port insertion site. °Get help right away if: °· You have chest pain or shortness of breath. °· You have bleeding from your port that you cannot control. °Summary °· Take care of the port as told by your health care provider. °· Change your dressing as told by your health care provider. °· Keep all follow-up visits as told by your health care provider. °This information is not intended to replace advice given to you by your health care provider. Make sure you discuss any questions you have with your health care provider. °Document Released: 11/10/2012 Document Revised: 12/12/2015 Document Reviewed:  12/12/2015 °Elsevier Interactive Patient Education © 2017 [employer] Inc. ° ° °

## 2016-08-05 NOTE — Procedures (Signed)
Placement of right jugular portacath.  Tip at SVC/RA junction.  Minimal blood loss and no immediate complication.   

## 2016-08-05 NOTE — H&P (Signed)
Chief Complaint: Hodgkin's Lymphoma  Referring Physician:Dr. Sullivan Lone  Supervising Physician: Markus Daft  Patient Status: [facility] - Out-pt  HPI: Rhonda Mccann is a 46 y.o. female who is known to IR recently for a neck lymph node biopsy. She has had no change in her medical history since that time.  She is feeling well with no complaints of SOB, CP, fevers, chills.  She presents today for a PAC placement so she can begin chemotherapy on 08-19-16.  Past Medical History:  Past Medical History:  Diagnosis Date  . Depression   . Drug abuse 2001    Past Surgical History:  Past Surgical History:  Procedure Laterality Date  . NEPHRECTOMY Left 2015  . NEPHRECTOMY LIVING DONOR  2013    Family History:  Family History  Problem Relation Age of Onset  . Hypertension Father   . High Cholesterol Father   . Breast cancer Paternal Aunt   . Pancreatic cancer Maternal Grandfather   . Non-Hodgkin's lymphoma Paternal Grandfather     Social History:  reports that she quit smoking about 20 years ago. Her smoking use included Cigarettes. She has never used smokeless tobacco. She reports that she does not drink alcohol or use drugs.  Allergies: No Known Allergies  Medications: Medications reviewed in epic  Please HPI for pertinent positives, otherwise complete 10 system ROS negative.  Mallampati Score: MD Evaluation Airway: WNL Heart: WNL Abdomen: WNL Chest/ Lungs: WNL ASA  Classification: 2 Mallampati/Airway Score: Two  Physical Exam: BP 118/79 (BP Location: Left Arm)   Pulse 82   Temp 98.1 F (36.7 C) (Oral)   Resp 18   LMP 07/21/2016   SpO2 100%  There is no height or weight on file to calculate BMI. General: pleasant, WD, WN white female who is laying in bed in NAD HEENT: head is normocephalic, atraumatic.  Sclera are noninjected.  PERRL.  Ears and nose without any masses or lesions.  Mouth is pink and moist Heart: regular, rate, and rhythm.  Normal s1,s2. No obvious  murmurs, gallops, or rubs noted.  Palpable radial pulses bilaterally Lungs: CTAB, no wheezes, rhonchi, or rales noted.  Respiratory effort nonlabored Abd: soft, NT, ND, +BS, no masses, hernias, or organomegaly Psych: A&Ox3 with an appropriate affect.   Labs: Results for orders placed or performed during the hospital encounter of 08/05/16 (from the past 48 hour(s))  CBC with Differential/Platelet     Status: None   Collection Time: 08/05/16  9:39 AM  Result Value Ref Range   WBC 8.3 4.0 - 10.5 K/uL   RBC 4.16 3.87 - 5.11 MIL/uL   Hemoglobin 13.1 12.0 - 15.0 g/dL   HCT 38.6 36.0 - 46.0 %   MCV 92.8 78.0 - 100.0 fL   MCH 31.5 26.0 - 34.0 pg   MCHC 33.9 30.0 - 36.0 g/dL   RDW 12.5 11.5 - 15.5 %   Platelets 329 150 - 400 K/uL   Neutrophils Relative % 71 %   Neutro Abs 6.0 1.7 - 7.7 K/uL   Lymphocytes Relative 21 %   Lymphs Abs 1.7 0.7 - 4.0 K/uL   Monocytes Relative 7 %   Monocytes Absolute 0.6 0.1 - 1.0 K/uL   Eosinophils Relative 1 %   Eosinophils Absolute 0.1 0.0 - 0.7 K/uL   Basophils Relative 0 %   Basophils Absolute 0.0 0.0 - 0.1 K/uL  Protime-INR     Status: None   Collection Time: 08/05/16  9:39 AM  Result Value  Ref Range   Prothrombin Time 12.8 11.4 - 15.2 seconds   INR 0.96     Imaging: No results found.  Assessment/Plan 1. Hodgkin's Lymphoma  We will plan to proceed today with port a cath placement.  Her labs and vitals have been reviewed. Risks and Benefits discussed with the patient including, but not limited to bleeding, infection, pneumothorax, or fibrin sheath development and need for additional procedures. All of the patient's questions were answered, patient is agreeable to proceed. Consent signed and in chart.   Thank you for this interesting consult.  I greatly enjoyed meeting KORTLYNN POUST and look forward to participating in their care.  A copy of this report was sent to the requesting provider on this date.  Electronically Signed: Henreitta Cea 08/05/2016, 10:36 AM   I spent a total of    25 Minutes in face to face in clinical consultation, greater than 50% of which was counseling/coordinating care for hodgkin's lymphoma

## 2016-08-07 ENCOUNTER — Other Ambulatory Visit: Payer: Self-pay | Admitting: *Deleted

## 2016-08-07 DIAGNOSIS — C8110 Nodular sclerosis classical Hodgkin lymphoma, unspecified site: Secondary | ICD-10-CM

## 2016-08-12 ENCOUNTER — Encounter: Payer: Self-pay | Admitting: Hematology

## 2016-08-12 ENCOUNTER — Other Ambulatory Visit: Payer: Self-pay

## 2016-08-12 ENCOUNTER — Telehealth: Payer: Self-pay

## 2016-08-12 NOTE — Telephone Encounter (Signed)
Notified pt that questions were received. Message sent to Dr. Irene Limbo for response. Plan to contact pt again tomorrow.

## 2016-08-13 ENCOUNTER — Telehealth: Payer: Self-pay

## 2016-08-13 ENCOUNTER — Other Ambulatory Visit: Payer: Self-pay

## 2016-08-13 NOTE — Telephone Encounter (Signed)
Contacted pt regarding questions about 24-hr urine, labs, tetanus shot, and home medications. Per Dr. Julien Nordmann, pt okay to receive tetanus shot on Friday (7/13) if she would like.   Will discuss other questions further with Dr. Burr Medico tomorrow. In-basket sent with details. Plan to f/u with pt tomorrow regarding labs and prescriptions.

## 2016-08-14 ENCOUNTER — Other Ambulatory Visit: Payer: Self-pay

## 2016-08-14 ENCOUNTER — Other Ambulatory Visit: Payer: Self-pay | Admitting: Hematology

## 2016-08-14 ENCOUNTER — Telehealth: Payer: Self-pay

## 2016-08-14 DIAGNOSIS — C8148 Lymphocyte-rich classical Hodgkin lymphoma, lymph nodes of multiple sites: Secondary | ICD-10-CM

## 2016-08-14 MED ORDER — LIDOCAINE-PRILOCAINE 2.5-2.5 % EX CREA: 1.0000 "application " | TOPICAL_CREAM | CUTANEOUS | 0 refills | Status: DC | PRN

## 2016-08-14 MED ORDER — LIDOCAINE-PRILOCAINE 2.5-2.5 % EX CREA: TOPICAL_CREAM | CUTANEOUS | Status: DC | PRN

## 2016-08-14 MED ORDER — PROCHLORPERAZINE MALEATE 10 MG PO TABS: 10.0000 mg | ORAL_TABLET | Freq: Four times a day (QID) | ORAL | 0 refills | Status: DC | PRN

## 2016-08-14 MED ORDER — ONDANSETRON HCL 8 MG PO TABS: 8.0000 mg | ORAL_TABLET | Freq: Three times a day (TID) | ORAL | 0 refills | Status: DC | PRN

## 2016-08-14 MED FILL — LIDOCAINE-PRILOCAINE CREAM: 2.5-2.5 | 10 days supply | Qty: 30 | Fill #0

## 2016-08-14 NOTE — Telephone Encounter (Signed)
Confirmed that PFT's were performed on 7/2. Zofran, compazine, and emla cream prescription sent electronically to [facility] on [address] per pt preference. Pt only needs to have labs drawn on Tuesday when she comes in for her appt. Echocardiogram planned tomorrow. Pt verbalized agreement and understanding.

## 2016-08-15 ENCOUNTER — Other Ambulatory Visit: Payer: Self-pay | Admitting: Family Medicine

## 2016-08-15 ENCOUNTER — Other Ambulatory Visit (HOSPITAL_COMMUNITY)
Admission: RE | Admit: 2016-08-15 | Discharge: 2016-08-15 | Disposition: A | Discharge status: Home / Self Care | Payer: 59 | Source: Ambulatory Visit | Attending: Family Medicine | Admitting: Family Medicine

## 2016-08-15 ENCOUNTER — Ambulatory Visit (HOSPITAL_COMMUNITY)
Admission: RE | Admit: 2016-08-15 | Discharge: 2016-08-15 | Disposition: A | Discharge status: Home / Self Care | Payer: 59 | Source: Ambulatory Visit | Attending: Hematology | Admitting: Hematology

## 2016-08-15 ENCOUNTER — Ambulatory Visit (HOSPITAL_BASED_OUTPATIENT_CLINIC_OR_DEPARTMENT_OTHER): Payer: 59

## 2016-08-15 DIAGNOSIS — C8110 Nodular sclerosis classical Hodgkin lymphoma, unspecified site: Secondary | ICD-10-CM | POA: Insufficient documentation

## 2016-08-15 DIAGNOSIS — Z0000 Encounter for general adult medical examination without abnormal findings: Secondary | ICD-10-CM | POA: Diagnosis not present

## 2016-08-15 DIAGNOSIS — Z01419 Encounter for gynecological examination (general) (routine) without abnormal findings: Secondary | ICD-10-CM | POA: Diagnosis present

## 2016-08-15 DIAGNOSIS — C8148 Lymphocyte-rich classical Hodgkin lymphoma, lymph nodes of multiple sites: Secondary | ICD-10-CM

## 2016-08-15 DIAGNOSIS — Z23 Encounter for immunization: Secondary | ICD-10-CM | POA: Diagnosis not present

## 2016-08-15 NOTE — Progress Notes (Signed)
  Echocardiogram 2D Echocardiogram has been performed.  Darlina Sicilian M 08/15/2016, 9:41 AM

## 2016-08-16 LAB — CREATININE CLEARANCE, URINE, 24 HOUR
CREATININE, UR: 59.9 mg/dL
CREATININE: 1 mg/dL (ref 0.57–1.00)
Creatinine Clearance: 129 mL/min — ABNORMAL HIGH (ref 88–128)
Creatinine, 24H Ur: 1857 mg/24 hr — ABNORMAL HIGH (ref 800–1800)
GFR, EST AFRICAN AMERICAN: 79 mL/min/{1.73_m2} (ref >59)
GFR, EST NON AFRICAN AMERICAN: 68 mL/min/{1.73_m2} (ref >59)

## 2016-08-18 ENCOUNTER — Other Ambulatory Visit: Payer: Self-pay | Admitting: *Deleted

## 2016-08-18 ENCOUNTER — Ambulatory Visit (HOSPITAL_COMMUNITY)
Admission: RE | Admit: 2016-08-18 | Discharge: 2016-08-18 | Disposition: A | Discharge status: Home / Self Care | Payer: 59 | Source: Ambulatory Visit | Attending: Hematology | Admitting: Hematology

## 2016-08-18 DIAGNOSIS — C8110 Nodular sclerosis classical Hodgkin lymphoma, unspecified site: Secondary | ICD-10-CM

## 2016-08-18 DIAGNOSIS — Z905 Acquired absence of kidney: Secondary | ICD-10-CM | POA: Insufficient documentation

## 2016-08-18 DIAGNOSIS — C8148 Lymphocyte-rich classical Hodgkin lymphoma, lymph nodes of multiple sites: Secondary | ICD-10-CM

## 2016-08-18 DIAGNOSIS — C8190 Hodgkin lymphoma, unspecified, unspecified site: Secondary | ICD-10-CM | POA: Diagnosis not present

## 2016-08-18 LAB — GLUCOSE, CAPILLARY: Glucose-Capillary: 103 mg/dL — ABNORMAL HIGH (ref 65–99)

## 2016-08-18 MED ORDER — FLUDEOXYGLUCOSE F - 18 (FDG) INJECTION
10.4000 | Freq: Once | INTRAVENOUS | Status: AC | PRN
  Administered 2016-08-18: 10.4 via INTRAVENOUS

## 2016-08-19 ENCOUNTER — Other Ambulatory Visit: Payer: 59

## 2016-08-19 ENCOUNTER — Encounter: Payer: Self-pay | Admitting: Hematology

## 2016-08-19 ENCOUNTER — Other Ambulatory Visit (HOSPITAL_COMMUNITY)
Admission: AD | Admit: 2016-08-19 | Discharge: 2016-08-19 | Disposition: A | Discharge status: Home / Self Care | Payer: 59 | Source: Ambulatory Visit | Attending: Hematology | Admitting: Hematology

## 2016-08-19 ENCOUNTER — Ambulatory Visit (HOSPITAL_COMMUNITY)
Admission: RE | Admit: 2016-08-19 | Discharge: 2016-08-19 | Disposition: A | Discharge status: Home / Self Care | Payer: 59 | Source: Ambulatory Visit | Attending: Hematology | Admitting: Hematology

## 2016-08-19 ENCOUNTER — Ambulatory Visit (HOSPITAL_BASED_OUTPATIENT_CLINIC_OR_DEPARTMENT_OTHER): Payer: 59 | Admitting: Hematology

## 2016-08-19 ENCOUNTER — Ambulatory Visit (HOSPITAL_BASED_OUTPATIENT_CLINIC_OR_DEPARTMENT_OTHER): Payer: 59

## 2016-08-19 VITALS — BP 141/95 | HR 69 | Temp 98.8°F | Resp 18 | Ht 69.0 in | Wt 208.5 lb

## 2016-08-19 DIAGNOSIS — C8148 Lymphocyte-rich classical Hodgkin lymphoma, lymph nodes of multiple sites: Secondary | ICD-10-CM | POA: Diagnosis not present

## 2016-08-19 DIAGNOSIS — Z807 Family history of other malignant neoplasms of lymphoid, hematopoietic and related tissues: Secondary | ICD-10-CM

## 2016-08-19 DIAGNOSIS — Z5111 Encounter for antineoplastic chemotherapy: Secondary | ICD-10-CM | POA: Diagnosis not present

## 2016-08-19 DIAGNOSIS — C8110 Nodular sclerosis classical Hodgkin lymphoma, unspecified site: Secondary | ICD-10-CM

## 2016-08-19 DIAGNOSIS — Z803 Family history of malignant neoplasm of breast: Secondary | ICD-10-CM | POA: Diagnosis not present

## 2016-08-19 DIAGNOSIS — Z87891 Personal history of nicotine dependence: Secondary | ICD-10-CM

## 2016-08-19 LAB — CBC WITH DIFFERENTIAL/PLATELET
BASO%: 0.1 % (ref 0.0–2.0)
Basophils Absolute: 0 10*3/uL (ref 0.0–0.1)
EOS%: 1.3 % (ref 0.0–7.0)
Eosinophils Absolute: 0.1 10*3/uL (ref 0.0–0.5)
HCT: 39.1 % (ref 34.8–46.6)
HGB: 12.8 g/dL (ref 11.6–15.9)
LYMPH%: 22.8 % (ref 14.0–49.7)
MCH: 31.8 pg (ref 25.1–34.0)
MCHC: 32.7 g/dL (ref 31.5–36.0)
MCV: 97 fL (ref 79.5–101.0)
MONO#: 0.4 10*3/uL (ref 0.1–0.9)
MONO%: 6.1 % (ref 0.0–14.0)
NEUT#: 4.8 10*3/uL (ref 1.5–6.5)
NEUT%: 69.7 % (ref 38.4–76.8)
Platelets: 324 10*3/uL (ref 145–400)
RBC: 4.03 10*6/uL (ref 3.70–5.45)
RDW: 12.6 % (ref 11.2–14.5)
WBC: 6.8 10*3/uL (ref 3.9–10.3)
lymph#: 1.6 10*3/uL (ref 0.9–3.3)

## 2016-08-19 LAB — COMPREHENSIVE METABOLIC PANEL
ALBUMIN: 3.8 g/dL (ref 3.5–5.0)
ALK PHOS: 91 U/L (ref 40–150)
ALT: 14 U/L (ref 0–55)
ANION GAP: 11 meq/L (ref 3–11)
AST: 12 U/L (ref 5–34)
BUN: 12.8 mg/dL (ref 7.0–26.0)
CALCIUM: 9.6 mg/dL (ref 8.4–10.4)
CO2: 24 mEq/L (ref 22–29)
Chloride: 107 mEq/L (ref 98–109)
Creatinine: 1.2 mg/dL — ABNORMAL HIGH (ref 0.6–1.1)
EGFR: 56 mL/min/{1.73_m2} — AB (ref >90)
Glucose: 85 mg/dl (ref 70–140)
POTASSIUM: 4.2 meq/L (ref 3.5–5.1)
Sodium: 142 mEq/L (ref 136–145)
Total Bilirubin: 0.28 mg/dL (ref 0.20–1.20)
Total Protein: 7.3 g/dL (ref 6.4–8.3)

## 2016-08-19 LAB — PREGNANCY, URINE: PREG TEST UR: NEGATIVE

## 2016-08-19 LAB — CYTOLOGY - PAP: DIAGNOSIS: NEGATIVE

## 2016-08-19 MED ORDER — SODIUM CHLORIDE 0.9 % IV SOLN
10.0000 [IU]/m2 | Freq: Once | INTRAVENOUS | Status: AC
  Administered 2016-08-19: 22 [IU] via INTRAVENOUS
  Filled 2016-08-19: qty 7.33

## 2016-08-19 MED ORDER — SODIUM CHLORIDE 0.9 % IV SOLN
Freq: Once | INTRAVENOUS | Status: AC
  Administered 2016-08-19: 15:00:00 via INTRAVENOUS

## 2016-08-19 MED ORDER — DACARBAZINE 200 MG IV SOLR
375.0000 mg/m2 | Freq: Once | INTRAVENOUS | Status: AC
  Administered 2016-08-19: 800 mg via INTRAVENOUS
  Filled 2016-08-19: qty 40

## 2016-08-19 MED ORDER — HEPARIN SOD (PORK) LOCK FLUSH 100 UNIT/ML IV SOLN
500.0000 [IU] | Freq: Once | INTRAVENOUS | Status: AC | PRN
  Administered 2016-08-19: 500 [IU]
  Filled 2016-08-19: qty 5

## 2016-08-19 MED ORDER — PALONOSETRON HCL INJECTION 0.25 MG/5ML
0.2500 mg | Freq: Once | INTRAVENOUS | Status: AC
  Administered 2016-08-19: 0.25 mg via INTRAVENOUS

## 2016-08-19 MED ORDER — PALONOSETRON HCL INJECTION 0.25 MG/5ML
INTRAVENOUS | Status: AC
  Filled 2016-08-19: qty 5

## 2016-08-19 MED ORDER — DOXORUBICIN HCL CHEMO IV INJECTION 2 MG/ML
25.0000 mg/m2 | Freq: Once | INTRAVENOUS | Status: AC
Start: 2016-08-19 — End: 2016-08-19
  Administered 2016-08-19: 54 mg via INTRAVENOUS
  Filled 2016-08-19: qty 27

## 2016-08-19 MED ORDER — SODIUM CHLORIDE 0.9% FLUSH
10.0000 mL | INTRAVENOUS | Status: DC | PRN
  Administered 2016-08-19: 10 mL
  Filled 2016-08-19: qty 10

## 2016-08-19 MED ORDER — VINBLASTINE SULFATE CHEMO INJECTION 1 MG/ML
6.0500 mg/m2 | Freq: Once | INTRAVENOUS | Status: AC
  Administered 2016-08-19: 13 mg via INTRAVENOUS
  Filled 2016-08-19: qty 13

## 2016-08-19 MED ORDER — SODIUM CHLORIDE 0.9 % IV SOLN
Freq: Once | INTRAVENOUS | Status: AC
  Administered 2016-08-19: 15:00:00 via INTRAVENOUS
  Filled 2016-08-19: qty 5

## 2016-08-19 NOTE — Patient Instructions (Signed)
Thank you for choosing [facility] to provide your oncology and hematology care.  To afford each patient quality time with our providers, please arrive 30 minutes before your scheduled appointment time.  If you arrive late for your appointment, you may be asked to reschedule.  We strive to give you quality time with our providers, and arriving late affects you and other patients whose appointments are after yours.   If you are a no show for multiple scheduled visits, you may be dismissed from the clinic at the providers discretion.    Again, thank you for choosing [facility], our hope is that these requests will decrease the amount of time that you wait before being seen by our physicians.  ______________________________________________________________________  Should you have questions after your visit to the [facility], please contact our office at [phone] between the hours of 8:30 and 4:30 p.m.    Voicemails left after 4:30p.m will not be returned until the following business day.    For prescription refill requests, please have your pharmacy contact us directly.  Please also try to allow 48 hours for prescription requests.    Please contact the scheduling department for questions regarding scheduling.  For scheduling of procedures such as PET scans, CT scans, MRI, Ultrasound, etc please contact central scheduling at [phone].    Resources For Cancer Patients and Caregivers:   Oncolink.org:  A wonderful resource for patients and healthcare providers for information regarding your disease, ways to tract your treatment, what to expect, etc.     American Cancer Society:  800-227-2345  Can help patients locate various types of support and financial assistance  Cancer Care: 1-800-813-HOPE (4673) Provides financial assistance, online support groups, medication/co-pay assistance.    [place] DSS:  336-641-3447 Where to apply for food  stamps, Medicaid, and utility assistance  Medicare Rights Center: 800-333-4114 Helps people with Medicare understand their rights and benefits, navigate the Medicare system, and secure the quality healthcare they deserve  [employer]: 336-333-6589 [employer] [employer]'s shared-ride transportation service for eligible riders who have a disability that prevents them from riding the fixed route bus.    For additional information on assistance programs please contact our social worker:   Grier Hock/Abigail Elmore:  [phone]            

## 2016-08-19 NOTE — Progress Notes (Signed)
1500-Patient reports flushing during the first few minutes of Emend/Decadron. Flushed with normal saline and the patient went back to baseline. Slowed infusion down to infuse over 30 minutes rather than 20 minutes.

## 2016-08-19 NOTE — Patient Instructions (Signed)
[facility] Discharge Instructions for Patients Receiving Chemotherapy  Today you received the following chemotherapy agents: Adriamycin, Vinblastine, Bleomycin, and Dacarbazine (DTIC).  To help prevent nausea and vomiting after your treatment, we encourage you to take your nausea medication as instructed.   If you develop nausea and vomiting that is not controlled by your nausea medication, call the clinic.   BELOW ARE SYMPTOMS THAT SHOULD BE REPORTED IMMEDIATELY:  *FEVER GREATER THAN 100.5 F  *CHILLS WITH OR WITHOUT FEVER  NAUSEA AND VOMITING THAT IS NOT CONTROLLED WITH YOUR NAUSEA MEDICATION  *UNUSUAL SHORTNESS OF BREATH  *UNUSUAL BRUISING OR BLEEDING  TENDERNESS IN MOUTH AND THROAT WITH OR WITHOUT PRESENCE OF ULCERS  *URINARY PROBLEMS  *BOWEL PROBLEMS  UNUSUAL RASH Items with * indicate a potential emergency and should be followed up as soon as possible.  Feel free to call the clinic you have any questions or concerns. The clinic phone number is [phone].  Please show the [facility] at check-in to the Emergency Department and triage nurse.  Adriamycin/Doxorubicin injection What is this medicine? DOXORUBICIN (dox oh ROO bi sin) is a chemotherapy drug. It is used to treat many kinds of cancer like leukemia, lymphoma, neuroblastoma, sarcoma, and Wilms' tumor. It is also used to treat bladder cancer, breast cancer, lung cancer, ovarian cancer, stomach cancer, and thyroid cancer. This medicine may be used for other purposes; ask your health care provider or pharmacist if you have questions. COMMON BRAND NAME(S): Adriamycin, Adriamycin PFS, Adriamycin RDF, Rubex What should I tell my health care provider before I take this medicine? They need to know if you have any of these conditions: -heart disease -history of low blood counts caused by a medicine -liver disease -recent or ongoing radiation therapy -an unusual or allergic reaction to  doxorubicin, other chemotherapy agents, other medicines, foods, dyes, or preservatives -pregnant or trying to get pregnant -breast-feeding How should I use this medicine? This drug is given as an infusion into a vein. It is administered in a hospital or clinic by a specially trained health care professional. If you have pain, swelling, burning or any unusual feeling around the site of your injection, tell your health care professional right away. Talk to your pediatrician regarding the use of this medicine in children. Special care may be needed. Overdosage: If you think you have taken too much of this medicine contact a poison control center or emergency room at once. NOTE: This medicine is only for you. Do not share this medicine with others. What if I miss a dose? It is important not to miss your dose. Call your doctor or health care professional if you are unable to keep an appointment. What may interact with this medicine? This medicine may interact with the following medications: -6-mercaptopurine -paclitaxel -phenytoin -St. John's Wort -trastuzumab -verapamil This list may not describe all possible interactions. Give your health care provider a list of all the medicines, herbs, non-prescription drugs, or dietary supplements you use. Also tell them if you smoke, drink alcohol, or use illegal drugs. Some items may interact with your medicine. What should I watch for while using this medicine? This drug may make you feel generally unwell. This is not uncommon, as chemotherapy can affect healthy cells as well as cancer cells. Report any side effects. Continue your course of treatment even though you feel ill unless your doctor tells you to stop. There is a maximum amount of this medicine you should receive throughout your life. The amount  depends on the medical condition being treated and your overall health. Your doctor will watch how much of this medicine you receive in your lifetime. Tell  your doctor if you have taken this medicine before. You may need blood work done while you are taking this medicine. Your urine may turn red for a few days after your dose. This is not blood. If your urine is dark or brown, call your doctor. In some cases, you may be given additional medicines to help with side effects. Follow all directions for their use. Call your doctor or health care professional for advice if you get a fever, chills or sore throat, or other symptoms of a cold or flu. Do not treat yourself. This drug decreases your body's ability to fight infections. Try to avoid being around people who are sick. This medicine may increase your risk to bruise or bleed. Call your doctor or health care professional if you notice any unusual bleeding. Talk to your doctor about your risk of cancer. You may be more at risk for certain types of cancers if you take this medicine. Do not become pregnant while taking this medicine or for 6 months after stopping it. Women should inform their doctor if they wish to become pregnant or think they might be pregnant. Men should not father a child while taking this medicine and for 6 months after stopping it. There is a potential for serious side effects to an unborn child. Talk to your health care professional or pharmacist for more information. Do not breast-feed an infant while taking this medicine. This medicine has caused ovarian failure in some women and reduced sperm counts in some men This medicine may interfere with the ability to have a child. Talk with your doctor or health care professional if you are concerned about your fertility. What side effects may I notice from receiving this medicine? Side effects that you should report to your doctor or health care professional as soon as possible: -allergic reactions like skin rash, itching or hives, swelling of the face, lips, or tongue -breathing problems -chest pain -fast or irregular heartbeat -low blood  counts - this medicine may decrease the number of white blood cells, red blood cells and platelets. You may be at increased risk for infections and bleeding. -pain, redness, or irritation at site where injected -signs of infection - fever or chills, cough, sore throat, pain or difficulty passing urine -signs of decreased platelets or bleeding - bruising, pinpoint red spots on the skin, black, tarry stools, blood in the urine -swelling of the ankles, feet, hands -tiredness -weakness Side effects that usually do not require medical attention (report to your doctor or health care professional if they continue or are bothersome): -diarrhea -hair loss -mouth sores -nail discoloration or damage -nausea -red colored urine -vomiting This list may not describe all possible side effects. Call your doctor for medical advice about side effects. You may report side effects to FDA at 1-800-FDA-1088. Where should I keep my medicine? This drug is given in a hospital or clinic and will not be stored at home. NOTE: This sheet is a summary. It may not cover all possible information. If you have questions about this medicine, talk to your doctor, pharmacist, or health care provider.  2018 Elsevier/Gold Standard (2015-03-19 11:28:51)  Vinblastine injection What is this medicine? VINBLASTINE (vin BLAS teen) is a chemotherapy drug. It slows the growth of cancer cells. This medicine is used to treat many types of cancer like breast  cancer, testicular cancer, Hodgkin's disease, non-Hodgkin's lymphoma, and sarcoma. This medicine may be used for other purposes; ask your health care provider or pharmacist if you have questions. COMMON BRAND NAME(S): Velban What should I tell my health care provider before I take this medicine? They need to know if you have any of these conditions: -blood disorders -dental disease -gout -infection (especially a virus infection such as chickenpox, cold sores, or herpes) -liver  disease -lung disease -nervous system disease -recent or ongoing radiation therapy -an unusual or allergic reaction to vinblastine, other chemotherapy agents, other medicines, foods, dyes, or preservatives -pregnant or trying to get pregnant -breast-feeding How should I use this medicine? This drug is given as an infusion into a vein. It is administered in a hospital or clinic by a specially trained health care professional. If you have pain, swelling, burning or any unusual feeling around the site of your injection, tell your health care professional right away. Talk to your pediatrician regarding the use of this medicine in children. While this drug may be prescribed for selected conditions, precautions do apply. Overdosage: If you think you have taken too much of this medicine contact a poison control center or emergency room at once. NOTE: This medicine is only for you. Do not share this medicine with others. What if I miss a dose? It is important not to miss your dose. Call your doctor or health care professional if you are unable to keep an appointment. What may interact with this medicine? Do not take this medicine with any of the following medications: -erythromycin -itraconazole -mibefradil -voriconazole This medicine may also interact with the following medications: -cyclosporine -fluconazole -ketoconazole -medicines for seizures like phenytoin -medicines to increase blood counts like filgrastim, pegfilgrastim, sargramostim -vaccines -verapamil Talk to your doctor or health care professional before taking any of these medicines: -acetaminophen -aspirin -ibuprofen -ketoprofen -naproxen This list may not describe all possible interactions. Give your health care provider a list of all the medicines, herbs, non-prescription drugs, or dietary supplements you use. Also tell them if you smoke, drink alcohol, or use illegal drugs. Some items may interact with your medicine. What  should I watch for while using this medicine? Your condition will be monitored carefully while you are receiving this medicine. You will need important blood work done while you are taking this medicine. This drug may make you feel generally unwell. This is not uncommon, as chemotherapy can affect healthy cells as well as cancer cells. Report any side effects. Continue your course of treatment even though you feel ill unless your doctor tells you to stop. In some cases, you may be given additional medicines to help with side effects. Follow all directions for their use. Call your doctor or health care professional for advice if you get a fever, chills or sore throat, or other symptoms of a cold or flu. Do not treat yourself. This drug decreases your body's ability to fight infections. Try to avoid being around people who are sick. This medicine may increase your risk to bruise or bleed. Call your doctor or health care professional if you notice any unusual bleeding. Be careful brushing and flossing your teeth or using a toothpick because you may get an infection or bleed more easily. If you have any dental work done, tell your dentist you are receiving this medicine. Avoid taking products that contain aspirin, acetaminophen, ibuprofen, naproxen, or ketoprofen unless instructed by your doctor. These medicines may hide a fever. Do not become pregnant while taking  this medicine. Women should inform their doctor if they wish to become pregnant or think they might be pregnant. There is a potential for serious side effects to an unborn child. Talk to your health care professional or pharmacist for more information. Do not breast-feed an infant while taking this medicine. Men may have a lower sperm count while taking this medicine. Talk to your doctor if you plan to father a child. What side effects may I notice from receiving this medicine? Side effects that you should report to your doctor or health care  professional as soon as possible: -allergic reactions like skin rash, itching or hives, swelling of the face, lips, or tongue -low blood counts - This drug may decrease the number of white blood cells, red blood cells and platelets. You may be at increased risk for infections and bleeding. -signs of infection - fever or chills, cough, sore throat, pain or difficulty passing urine -signs of decreased platelets or bleeding - bruising, pinpoint red spots on the skin, black, tarry stools, nosebleeds -signs of decreased red blood cells - unusually weak or tired, fainting spells, lightheadedness -breathing problems -changes in hearing -change in the amount of urine -chest pain -high blood pressure -mouth sores -nausea and vomiting -pain, swelling, redness or irritation at the injection site -pain, tingling, numbness in the hands or feet -problems with balance, dizziness -seizures Side effects that usually do not require medical attention (report to your doctor or health care professional if they continue or are bothersome): -constipation -hair loss -jaw pain -loss of appetite -sensitivity to light -stomach pain -tumor pain This list may not describe all possible side effects. Call your doctor for medical advice about side effects. You may report side effects to FDA at 1-800-FDA-1088. Where should I keep my medicine? This drug is given in a hospital or clinic and will not be stored at home. NOTE: This sheet is a summary. It may not cover all possible information. If you have questions about this medicine, talk to your doctor, pharmacist, or health care provider.  2018 Elsevier/Gold Standard (2007-10-18 17:15:59)  Bleomycin injection What is this medicine? BLEOMYCIN (blee oh MYE sin) is a chemotherapy drug. It is used to treat many kinds of cancer like lymphoma, cervical cancer, head and neck cancer, and testicular cancer. It is also used to prevent and to treat fluid build-up around the  lungs caused by some cancers. This medicine may be used for other purposes; ask your health care provider or pharmacist if you have questions. COMMON BRAND NAME(S): Blenoxane What should I tell my health care provider before I take this medicine? They need to know if you have any of these conditions: -cigarette smoker -kidney disease -lung disease -recent or ongoing radiation therapy -an unusual or allergic reaction to bleomycin, other chemotherapy agents, other medicines, foods, dyes, or preservatives -pregnant or trying to get pregnant -breast-feeding How should I use this medicine? This drug is given as an infusion into a vein or a body cavity. It can also be given as an injection into a muscle or under the skin. It is administered in a hospital or clinic by a specially trained health care professional. Talk to your pediatrician regarding the use of this medicine in children. Special care may be needed. Overdosage: If you think you have taken too much of this medicine contact a poison control center or emergency room at once. NOTE: This medicine is only for you. Do not share this medicine with others. What if I miss  a dose? It is important not to miss your dose. Call your doctor or health care professional if you are unable to keep an appointment. What may interact with this medicine? -certain antibiotics given by injection -cisplatin -cyclosporine -diuretics -foscarnet -medicines to increase blood counts like filgrastim, pegfilgrastim, sargramostim -vaccines This list may not describe all possible interactions. Give your health care provider a list of all the medicines, herbs, non-prescription drugs, or dietary supplements you use. Also tell them if you smoke, drink alcohol, or use illegal drugs. Some items may interact with your medicine. What should I watch for while using this medicine? Visit your doctor for checks on your progress. This drug may make you feel generally unwell.  This is not uncommon, as chemotherapy can affect healthy cells as well as cancer cells. Report any side effects. Continue your course of treatment even though you feel ill unless your doctor tells you to stop. Call your doctor or health care professional for advice if you get a fever, chills or sore throat, or other symptoms of a cold or flu. Do not treat yourself. This drug decreases your body's ability to fight infections. Try to avoid being around people who are sick. Avoid taking products that contain aspirin, acetaminophen, ibuprofen, naproxen, or ketoprofen unless instructed by your doctor. These medicines may hide a fever. Do not become pregnant while taking this medicine. Women should inform their doctor if they wish to become pregnant or think they might be pregnant. There is a potential for serious side effects to an unborn child. Talk to your health care professional or pharmacist for more information. Do not breast-feed an infant while taking this medicine. There is a maximum amount of this medicine you should receive throughout your life. The amount depends on the medical condition being treated and your overall health. Your doctor will watch how much of this medicine you receive in your lifetime. Tell your doctor if you have taken this medicine before. What side effects may I notice from receiving this medicine? Side effects that you should report to your doctor or health care professional as soon as possible: -allergic reactions like skin rash, itching or hives, swelling of the face, lips, or tongue -breathing problems -chest pain -confusion -cough -fast, irregular heartbeat -feeling faint or lightheaded, falls -fever or chills -mouth sores -pain, tingling, numbness in the hands or feet -trouble passing urine or change in the amount of urine -yellowing of the eyes or skin Side effects that usually do not require medical attention (report to your doctor or health care professional if  they continue or are bothersome): -darker skin color -hair loss -irritation at site where injected -loss of appetite -nail changes -nausea and vomiting -weight loss This list may not describe all possible side effects. Call your doctor for medical advice about side effects. You may report side effects to FDA at 1-800-FDA-1088. Where should I keep my medicine? This drug is given in a hospital or clinic and will not be stored at home. NOTE: This sheet is a summary. It may not cover all possible information. If you have questions about this medicine, talk to your doctor, pharmacist, or health care provider.  2018 Elsevier/Gold Standard (2012-05-18 09:36:48)  DTIC/Dacarbazine, DTIC injection What is this medicine? DACARBAZINE (da KAR ba zeen) is a chemotherapy drug. This medicine is used to treat skin cancer. It is also used with other medicines to treat Hodgkin's disease. This medicine may be used for other purposes; ask your health care provider or pharmacist  if you have questions. COMMON BRAND NAME(S): DTIC-Dome What should I tell my health care provider before I take this medicine? They need to know if you have any of these conditions: -infection (especially virus infection such as chickenpox, cold sores, or herpes) -kidney disease -liver disease -low blood counts like low platelets, red blood cells, white blood cells -recent radiation therapy -an unusual or allergic reaction to dacarbazine, other chemotherapy agents, other medicines, foods, dyes, or preservatives -pregnant or trying to get pregnant -breast-feeding How should I use this medicine? This drug is given as an injection or infusion into a vein. It is administered in a hospital or clinic by a specially trained health care professional. Talk to your pediatrician regarding the use of this medicine in children. While this drug may be prescribed for selected conditions, precautions do apply. Overdosage: If you think you have  taken too much of this medicine contact a poison control center or emergency room at once. NOTE: This medicine is only for you. Do not share this medicine with others. What if I miss a dose? It is important not to miss your dose. Call your doctor or health care professional if you are unable to keep an appointment. What may interact with this medicine? -medicines to increase blood counts like filgrastim, pegfilgrastim, sargramostim -vaccines This list may not describe all possible interactions. Give your health care provider a list of all the medicines, herbs, non-prescription drugs, or dietary supplements you use. Also tell them if you smoke, drink alcohol, or use illegal drugs. Some items may interact with your medicine. What should I watch for while using this medicine? Your condition will be monitored carefully while you are receiving this medicine. You will need important blood work done while you are taking this medicine. This drug may make you feel generally unwell. This is not uncommon, as chemotherapy can affect healthy cells as well as cancer cells. Report any side effects. Continue your course of treatment even though you feel ill unless your doctor tells you to stop. Call your doctor or health care professional for advice if you get a fever, chills or sore throat, or other symptoms of a cold or flu. Do not treat yourself. This drug decreases your body's ability to fight infections. Try to avoid being around people who are sick. This medicine may increase your risk to bruise or bleed. Call your doctor or health care professional if you notice any unusual bleeding. Talk to your doctor about your risk of cancer. You may be more at risk for certain types of cancers if you take this medicine. Do not become pregnant while taking this medicine. Women should inform their doctor if they wish to become pregnant or think they might be pregnant. There is a potential for serious side effects to an  unborn child. Talk to your health care professional or pharmacist for more information. Do not breast-feed an infant while taking this medicine. What side effects may I notice from receiving this medicine? Side effects that you should report to your doctor or health care professional as soon as possible: -allergic reactions like skin rash, itching or hives, swelling of the face, lips, or tongue -low blood counts - this medicine may decrease the number of white blood cells, red blood cells and platelets. You may be at increased risk for infections and bleeding. -signs of infection - fever or chills, cough, sore throat, pain or difficulty passing urine -signs of decreased platelets or bleeding - bruising, pinpoint red spots on  the skin, black, tarry stools, blood in the urine -signs of decreased red blood cells - unusually weak or tired, fainting spells, lightheadedness -breathing problems -muscle pains -pain at site where injected -trouble passing urine or change in the amount of urine -vomiting -yellowing of the eyes or skin Side effects that usually do not require medical attention (report to your doctor or health care professional if they continue or are bothersome): -diarrhea -hair loss -loss of appetite -nausea -skin more sensitive to sun or ultraviolet light -stomach upset This list may not describe all possible side effects. Call your doctor for medical advice about side effects. You may report side effects to FDA at 1-800-FDA-1088. Where should I keep my medicine? This drug is given in a hospital or clinic and will not be stored at home. NOTE: This sheet is a summary. It may not cover all possible information. If you have questions about this medicine, talk to your doctor, pharmacist, or health care provider.  2018 [employer]vier/Gold Standard (2015-03-23 15:17:39)

## 2016-08-21 ENCOUNTER — Encounter: Payer: Self-pay | Admitting: *Deleted

## 2016-08-21 ENCOUNTER — Telehealth: Payer: Self-pay | Admitting: *Deleted

## 2016-08-21 NOTE — Telephone Encounter (Signed)
Triage after hours note received stating pt called c/o nausea.  Called pt to check on status.  Pt stated she is feeling well, no current issues.  No fever/chills/N/V.  Pt verbalized she is taking compazine as directed.  Pt stated she has no upcoming apts.  Message sent to scheduling to contact pt.  Pt thankful for call.

## 2016-08-22 ENCOUNTER — Telehealth: Payer: Self-pay | Admitting: Hematology

## 2016-08-22 NOTE — Telephone Encounter (Signed)
sw pt to confirm 7/24 appts per sch msg

## 2016-08-24 NOTE — Progress Notes (Signed)
Marland Kitchen    HEMATOLOGY/ONCOLOGY CLINIC NOTE  Date of Service: .08/19/2016  Patient Care Team: Donald Prose, MD as PCP - General (Family Medicine)  CHIEF COMPLAINTS/PURPOSE OF CONSULTATION:  Newly diagnosed Hodgkins lymphoma  HISTORY OF PRESENTING ILLNESS:   Rhonda Mccann is a wonderful 46 y.o. female who has been referred to [place] by Dr .Donald Prose, MD for evaluation and management of newly diagnosed Hodgkins Lymphoma.  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  Patient first reported noticing a lump in the left side of her neck in January 2018. She noted some mild progressive increase in this lump which appeared more prominent and she reported it to her primary care physician. Patient had an ultrasound of her neck on 07/07/2016 which showed a 1.9 x 0.8 x 1.4 cm lymph node with no distinct fatty hilum. This was subsequently biopsied under ultrasound guidance on 07/21/2016 and findings are consistent with classical Hodgkin's lymphoma nodular sclerosis subtype.  Patient notes no other overtly palpable lymphadenopathy noted. No fevers no chills no night sweats no unexpected weight loss. She reports no lethargy no anorexia. Overall feels quite well.  INTERVAL HISTORY  Patient is here for follow-up of her workup for Hodgkin's lymphoma and to start treatment. Patient had her echo PFTs and PET CT scan and has completed her chemotherapy counseling to start ABVD. Port has been placed uneventfully. She reports no acute new symptoms and feels rate to start treatment.  MEDICAL HISTORY:  Past Medical History:  Diagnosis Date  . Depression   . Drug abuse 2001  EX alcohol abuse sober for 17 years  Donated left kidney in 2013 (single kidney) Scoliosis GERD  SURGICAL HISTORY: Past Surgical History:  Procedure Laterality Date  . IR FLUORO GUIDE PORT INSERTION RIGHT  08/05/2016    . IR US GUIDE VASC ACCESS RIGHT  08/05/2016  . NEPHRECTOMY Left 2015  . NEPHRECTOMY LIVING DONOR  2013  Breast Augmentation Surgery in 1994 (saline implants)  SOCIAL HISTORY: Social History   Social History  . Marital status: Married    Spouse name: N/A  . Number of children: N/A  . Years of education: N/A   Occupational History  . Not on file.   Social History Main Topics  . Smoking status: Former Smoker    Types: Cigarettes    Quit date: 1998  . Smokeless tobacco: Never Used     Comment: quit 17 yrs ago  . Alcohol use No  . Drug use: No     Comment: Previous drug user - quite 2001  . Sexual activity: Yes   Other Topics Concern  . Not on file   Social History Narrative  . No narrative on file  Works as a Production manager   FAMILY HISTORY: Paternal aunt had breast cancer Paternal grandfather had lymphoma  ALLERGIES:  is allergic to contrast media [iodinated diagnostic agents].  MEDICATIONS:  Current Outpatient Prescriptions  Medication Sig Dispense Refill  . buPROPion (WELLBUTRIN XL) 300 MG 24 hr tablet Take 1 tablet by mouth daily.    Marland Kitchen ibuprofen (ADVIL,MOTRIN) 800 MG tablet Take 800 mg by mouth every 8 (eight) hours as needed.    . lidocaine-prilocaine (EMLA) cream Apply 1 application topically as needed. 30 g 0  . ondansetron (ZOFRAN) 8 MG tablet Take 1 tablet (8 mg total) by mouth every 8 (eight) hours as needed for nausea or vomiting. [place]  tablet 0  . prochlorperazine (COMPAZINE) 10 MG tablet Take 1 tablet (10 mg total) by mouth every 6 (six) hours as needed for nausea or vomiting. 30 tablet 0   No current facility-administered medications for this visit.     REVIEW OF SYSTEMS:    10 Point review of Systems was done is negative except as noted above.  PHYSICAL EXAMINATION: ECOG PERFORMANCE STATUS: 0 - Asymptomatic  . Vitals:   08/19/16 1235  BP: (!) 141/95  Pulse: 69  Resp: 18  Temp: 98.8 F (37.1 C)   Filed Weights   08/19/16 1235  Weight: 208 lb  8 oz (94.6 kg)   .Body mass index is 30.79 kg/m.  GENERAL:alert, in no acute distress and comfortable SKIN: no acute rashes, no significant lesions EYES: conjunctiva are pink and non-injected, sclera anicteric OROPHARYNX: MMM, no exudates, no oropharyngeal erythema or ulceration NECK: supple, no JVD LYMPH: small cervical LNadenopathy in the left posterior neck. no palpable lymphadenopathy in the axillary or inguinal regions LUNGS: clear to auscultation b/l with normal respiratory effort HEART: regular rate & rhythm ABDOMEN:  normoactive bowel sounds , non tender, not distended. Extremity: no pedal edema PSYCH: alert & oriented x 3 with fluent speech NEURO: no focal motor/sensory deficits  LABORATORY DATA:  I have reviewed the data as listed  . CBC Latest Ref Rng & Units 08/19/2016 08/05/2016 07/29/2016  WBC 3.9 - 10.3 10e3/uL 6.8 8.3 8.2  Hemoglobin 11.6 - 15.9 g/dL 12.8 13.1 13.7  Hematocrit 34.8 - 46.6 % 39.1 38.6 41.8  Platelets 145 - 400 10e3/uL 324 329 338   Component     Latest Ref Rng & Units 07/29/2016  WBC     3.9 - 10.3 10e3/uL 8.2  NEUT#     1.5 - 6.5 10e3/uL 5.8  Hemoglobin     11.6 - 15.9 g/dL 13.7  HCT     34.8 - 46.6 % 41.8  Platelets     145 - 400 10e3/uL 338  MCV     79.5 - 101.0 fL 97.4  MCH     25.1 - 34.0 pg 31.9  MCHC     31.5 - 36.0 g/dL 32.8  RBC     3.70 - 5.45 10e6/uL 4.29  RDW     11.2 - 14.5 % 12.7  lymph#     0.9 - 3.3 10e3/uL 1.9  MONO#     0.1 - 0.9 10e3/uL 0.4  Eosinophils Absolute     0.0 - 0.5 10e3/uL 0.1  Basophils Absolute     0.0 - 0.1 10e3/uL 0.0  NEUT%     38.4 - 76.8 % 70.6  LYMPH%     14.0 - 49.7 % 22.9  MONO%     0.0 - 14.0 % 5.2  EOS%     0.0 - 7.0 % 1.1  BASO%     0.0 - 2.0 % 0.2  Retic %     0.70 - 2.10 % 1.94  Retic Ct Abs     33.70 - 90.70 10e3/uL 83.23  Immature Retic Fract     1.60 - 10.00 % 4.50  Sodium     136 - 145 mEq/L 140  Potassium     3.5 - 5.1 mEq/L 3.6  Chloride     98 - 109 mEq/L 105    CO2     22 - 29 mEq/L 22  Glucose     70 - 140 mg/dl 88  BUN     7.0 -  26.0 mg/dL 8.6  Creatinine     0.6 - 1.1 mg/dL 1.1  Total Bilirubin     0.20 - 1.20 mg/dL 0.38  Alkaline Phosphatase     40 - 150 U/L 105  AST     5 - 34 U/L 14  ALT     0 - 55 U/L 21  Total Protein     6.4 - 8.3 g/dL 8.1  Albumin     3.5 - 5.0 g/dL 4.2  Calcium     8.4 - 10.4 mg/dL 9.7  Anion gap     3 - 11 mEq/L 12 (H)  EGFR     >90 ml/min/1.73 m2 60 (L)  Sed Rate     0 - 32 mm/hr 18  LDH     125 - 245 U/L 166  HIV Screen 4th Generation wRfx     Non Reactive Non Reactive  Hep C Virus Ab     0.0 - 0.9 s/co ratio <0.1  Hepatitis B Surface Ag     Negative Negative  Hep B Core Ab, Tot     Negative Negative        RADIOGRAPHIC STUDIES: I have personally reviewed the radiological images as listed and agreed with the findings in the report. Nm Pet Image Initial (pi) Skull Base To Thigh  Result Date: 08/18/2016 CLINICAL DATA:  Initial treatment strategy for Hodgkin's lymphoma. EXAM: NUCLEAR MEDICINE PET SKULL BASE TO THIGH TECHNIQUE: 10.4 mCi F-18 FDG was injected intravenously. Full-ring PET imaging was performed from the skull base to thigh after the radiotracer. CT data was obtained and used for attenuation correction and anatomic localization. FASTING BLOOD GLUCOSE:  Value: 103 mg/dl COMPARISON:  None. FINDINGS: NECK Enlarged and hypermetabolic neck nodes are demonstrated. 20 x 10 mm left-sided level 5 lymph node on image number 28 with SUV max 12.4. Moderate hypermetabolism in the tonsillar regions bilaterally with SUV max of 6.8 is concerning for lymphomatous involvement. CHEST Bilateral supraclavicular lymphadenopathy. The largest lymph node on the left side on image number 44 measures 20.5 x 12.5 mm and has an SUV max of 7.0. Index lymph node in the left axilla on image number 53 measures 14.5 x 11.5 mm and has an SUV max of 11.2. Extensive hypermetabolic anterior mediastinal lymphadenopathy  with SUV max of 10.5. The largest measurable nodal mass is 4.7 x 2.4 cm on image number 62. No hilar lymphadenopathy. No worrisome pulmonary lesions or acute pulmonary findings. ABDOMEN/PELVIS No abnormal hypermetabolic activity within the liver, pancreas, adrenal glands, or spleen. No hypermetabolic lymph nodes in the abdomen or pelvis. Solitary right kidney is noted. Endometrial activity is likely due to secretory phase of ovulation. SKELETON No focal hypermetabolic activity to suggest skeletal metastasis. IMPRESSION: 1. Hypermetabolic lymphadenopathy in the neck and chest as described above consistent with known Hodgkin's lymphoma. Disease is confined to above the diaphragms. The 2. No evidence of osseous involvement. 3. Solitary right kidney.  Status post left nephrectomy. 4. Endometrial activity, likely due to secretory phase of ovulation. Electronically Signed   By: Marijo Sanes M.D.   On: 08/18/2016 09:51   Ir US Guide Vasc Access Right  Result Date: 08/05/2016 INDICATION: 46 year old with recently diagnosed Hodgkin's lymphoma. Patient needs Port-A-Cath for treatment. EXAM: FLUOROSCOPIC AND ULTRASOUND GUIDED PLACEMENT OF A SUBCUTANEOUS PORT COMPARISON:  None. MEDICATIONS: Ancef 2 g; The antibiotic was administered within an appropriate time interval prior to skin puncture. ANESTHESIA/SEDATION: Versed 4.0 mg IV; Fentanyl 100 mcg IV; Moderate Sedation Time:  36 minutes The patient was continuously monitored during the procedure by the interventional radiology nurse under my direct supervision. FLUOROSCOPY TIME:  24 seconds, 4 mGy COMPLICATIONS: None immediate. PROCEDURE: The procedure, risks, benefits, and alternatives were explained to the patient. Questions regarding the procedure were encouraged and answered. The patient understands and consents to the procedure. Patient was placed supine on the interventional table. Ultrasound confirmed a patent right internal jugular vein. The right chest and neck were  cleaned with a skin antiseptic and a sterile drape was placed. Maximal barrier sterile technique was utilized including caps, mask, sterile gowns, sterile gloves, sterile drape, hand hygiene and skin antiseptic. The right neck was anesthetized with 1% lidocaine. Small incision was made in the right neck with a blade. Micropuncture set was placed in the right internal jugular vein with ultrasound guidance. The micropuncture wire was used for measurement purposes. The right chest was anesthetized with 1% lidocaine with epinephrine. #15 blade was used to make an incision and a subcutaneous port pocket was formed. [place] was assembled. Subcutaneous tunnel was formed with a stiff tunneling device. The port catheter was brought through the subcutaneous tunnel. The port was placed in the subcutaneous pocket. The micropuncture set was exchanged for a peel-away sheath. The catheter was placed through the peel-away sheath and the tip was positioned at the superior cavoatrial junction. Catheter placement was confirmed with fluoroscopy. The port was accessed and flushed with heparinized saline. The port pocket was closed using two layers of absorbable sutures and Dermabond. The vein skin site was closed using a single layer of absorbable suture and Dermabond. Sterile dressings were applied. Patient tolerated the procedure well without an immediate complication. Ultrasound and fluoroscopic images were taken and saved for this procedure. IMPRESSION: Placement of a subcutaneous port device. Electronically Signed   By: Markus Daft M.D.   On: 08/05/2016 12:48   Ir Fluoro Guide Port Insertion Right  Result Date: 08/05/2016 INDICATION: 46 year old with recently diagnosed Hodgkin's lymphoma. Patient needs Port-A-Cath for treatment. EXAM: FLUOROSCOPIC AND ULTRASOUND GUIDED PLACEMENT OF A SUBCUTANEOUS PORT COMPARISON:  None. MEDICATIONS: Ancef 2 g; The antibiotic was administered within an appropriate time interval prior  to skin puncture. ANESTHESIA/SEDATION: Versed 4.0 mg IV; Fentanyl 100 mcg IV; Moderate Sedation Time:  36 minutes The patient was continuously monitored during the procedure by the interventional radiology nurse under my direct supervision. FLUOROSCOPY TIME:  24 seconds, 4 mGy COMPLICATIONS: None immediate. PROCEDURE: The procedure, risks, benefits, and alternatives were explained to the patient. Questions regarding the procedure were encouraged and answered. The patient understands and consents to the procedure. Patient was placed supine on the interventional table. Ultrasound confirmed a patent right internal jugular vein. The right chest and neck were cleaned with a skin antiseptic and a sterile drape was placed. Maximal barrier sterile technique was utilized including caps, mask, sterile gowns, sterile gloves, sterile drape, hand hygiene and skin antiseptic. The right neck was anesthetized with 1% lidocaine. Small incision was made in the right neck with a blade. Micropuncture set was placed in the right internal jugular vein with ultrasound guidance. The micropuncture wire was used for measurement purposes. The right chest was anesthetized with 1% lidocaine with epinephrine. #15 blade was used to make an incision and a subcutaneous port pocket was formed. [place] was assembled. Subcutaneous tunnel was formed with a stiff tunneling device. The port catheter was brought through the subcutaneous tunnel. The port was placed in the subcutaneous pocket. The micropuncture  set was exchanged for a peel-away sheath. The catheter was placed through the peel-away sheath and the tip was positioned at the superior cavoatrial junction. Catheter placement was confirmed with fluoroscopy. The port was accessed and flushed with heparinized saline. The port pocket was closed using two layers of absorbable sutures and Dermabond. The vein skin site was closed using a single layer of absorbable suture and Dermabond.  Sterile dressings were applied. Patient tolerated the procedure well without an immediate complication. Ultrasound and fluoroscopic images were taken and saved for this procedure. IMPRESSION: Placement of a subcutaneous port device. Electronically Signed   By: Markus Daft M.D.   On: 08/05/2016 12:48    ASSESSMENT & PLAN:   46 yo very pleasant caucasian female with   1) Newly diagnosed Stage IIA (adverse risk due to >3 regions of involvement, non bulky) Classical Hodgkins Lymphoma -Nodular Sclerosis Subtype No constitutional symptoms. Sed rate 18 (WNL) PET/CT reviewed ECHO EF 60-65% DLCO uncorrected 70% of predicted. 24h creatinine clearance WNL. Plan -PET/CT reviewed with patient. -discussed the diagnosis of classical Hodgkin lymphoma, staging, prognosis, treatment and typical outcomes in detail with the patient . -chemo-counseling has been completed -Port-a-cath placement done -will plan to treat ABVD x 2 cycles then rpt PET/CT. If [place] 1-2 will treat with additional 4 cycles of AVD (dropping the bleomycin)  2) Single kidney - patient donated left kidney in 2013 -avoid nephrotoxic medications -24h creatinine creatinine clearance adequate for standard dose chemotherapy.  -Continue Treatment as per orders. Plz schedule 1st 2 cycles of treatment. -RTC with Dr Irene Limbo in 1 week for a toxicity check with labs  All of the patients questions were answered with apparent satisfaction. The patient knows to call the clinic with any problems, questions or concerns.  I spent 30 minutes counseling the patient face to face. The total time spent in the appointment was 40 minutes and more than 50% was on counseling and direct patient cares.    Sullivan Lone MD [place] AAHIVMS [facility] Hematology/Oncology Physician [facility]  (Office):       [phone] (Work cell):  [phone] (Fax):           [phone]

## 2016-08-26 ENCOUNTER — Other Ambulatory Visit (HOSPITAL_BASED_OUTPATIENT_CLINIC_OR_DEPARTMENT_OTHER): Payer: 59

## 2016-08-26 ENCOUNTER — Encounter: Payer: Self-pay | Admitting: Hematology

## 2016-08-26 ENCOUNTER — Ambulatory Visit (HOSPITAL_BASED_OUTPATIENT_CLINIC_OR_DEPARTMENT_OTHER): Payer: 59 | Admitting: Hematology

## 2016-08-26 VITALS — BP 118/44 | HR 95 | Temp 98.4°F | Resp 18 | Ht 69.0 in | Wt 202.9 lb

## 2016-08-26 DIAGNOSIS — K1230 Oral mucositis (ulcerative), unspecified: Secondary | ICD-10-CM

## 2016-08-26 DIAGNOSIS — C8111 Nodular sclerosis classical Hodgkin lymphoma, lymph nodes of head, face, and neck: Secondary | ICD-10-CM | POA: Diagnosis not present

## 2016-08-26 DIAGNOSIS — C8148 Lymphocyte-rich classical Hodgkin lymphoma, lymph nodes of multiple sites: Secondary | ICD-10-CM | POA: Diagnosis not present

## 2016-08-26 DIAGNOSIS — Z905 Acquired absence of kidney: Secondary | ICD-10-CM | POA: Diagnosis not present

## 2016-08-26 DIAGNOSIS — R110 Nausea: Secondary | ICD-10-CM | POA: Diagnosis not present

## 2016-08-26 DIAGNOSIS — C8110 Nodular sclerosis classical Hodgkin lymphoma, unspecified site: Secondary | ICD-10-CM

## 2016-08-26 DIAGNOSIS — D701 Agranulocytosis secondary to cancer chemotherapy: Secondary | ICD-10-CM | POA: Diagnosis not present

## 2016-08-26 LAB — COMPREHENSIVE METABOLIC PANEL
ALT: 27 U/L (ref 0–55)
AST: 19 U/L (ref 5–34)
Albumin: 4.1 g/dL (ref 3.5–5.0)
Alkaline Phosphatase: 77 U/L (ref 40–150)
Anion Gap: 10 mEq/L (ref 3–11)
BUN: 9.1 mg/dL (ref 7.0–26.0)
CALCIUM: 9.4 mg/dL (ref 8.4–10.4)
CHLORIDE: 104 meq/L (ref 98–109)
CO2: 24 meq/L (ref 22–29)
CREATININE: 1.1 mg/dL (ref 0.6–1.1)
EGFR: 61 mL/min/{1.73_m2} — ABNORMAL LOW (ref >90)
GLUCOSE: 97 mg/dL (ref 70–140)
POTASSIUM: 4 meq/L (ref 3.5–5.1)
SODIUM: 138 meq/L (ref 136–145)
Total Bilirubin: 0.6 mg/dL (ref 0.20–1.20)
Total Protein: 7.6 g/dL (ref 6.4–8.3)

## 2016-08-26 LAB — CBC & DIFF AND RETIC
BASO%: 0.5 % (ref 0.0–2.0)
BASOS ABS: 0 10*3/uL (ref 0.0–0.1)
EOS ABS: 0.1 10*3/uL (ref 0.0–0.5)
EOS%: 2.5 % (ref 0.0–7.0)
HEMATOCRIT: 39.8 % (ref 34.8–46.6)
HGB: 13 g/dL (ref 11.6–15.9)
Immature Retic Fract: 1.4 % — ABNORMAL LOW (ref 1.60–10.00)
LYMPH#: 1 10*3/uL (ref 0.9–3.3)
LYMPH%: 50.8 % — ABNORMAL HIGH (ref 14.0–49.7)
MCH: 31.3 pg (ref 25.1–34.0)
MCHC: 32.7 g/dL (ref 31.5–36.0)
MCV: 95.7 fL (ref 79.5–101.0)
MONO#: 0 10*3/uL — ABNORMAL LOW (ref 0.1–0.9)
MONO%: 1 % (ref 0.0–14.0)
NEUT%: 45.2 % (ref 38.4–76.8)
NEUTROS ABS: 0.9 10*3/uL — AB (ref 1.5–6.5)
Platelets: 261 10*3/uL (ref 145–400)
RBC: 4.16 10*6/uL (ref 3.70–5.45)
RDW: 12 % (ref 11.2–14.5)
RETIC %: 0.4 % — AB (ref 0.70–2.10)
RETIC CT ABS: 16.64 10*3/uL — AB (ref 33.70–90.70)
WBC: 2 10*3/uL — AB (ref 3.9–10.3)

## 2016-08-26 MED ORDER — DEXAMETHASONE 4 MG PO TABS: 8.0000 mg | ORAL_TABLET | ORAL | 2 refills | Status: DC

## 2016-08-26 MED ORDER — LORAZEPAM 0.5 MG PO TABS: 0.5000 mg | ORAL_TABLET | Freq: Three times a day (TID) | ORAL | 0 refills | Status: DC

## 2016-08-26 NOTE — Patient Instructions (Signed)
Thank you for choosing [facility] to provide your oncology and hematology care.  To afford each patient quality time with our providers, please arrive 30 minutes before your scheduled appointment time.  If you arrive late for your appointment, you may be asked to reschedule.  We strive to give you quality time with our providers, and arriving late affects you and other patients whose appointments are after yours.  If you are a no show for multiple scheduled visits, you may be dismissed from the clinic at the providers discretion.   Again, thank you for choosing [facility], our hope is that these requests will decrease the amount of time that you wait before being seen by our physicians.  ______________________________________________________________________ Should you have questions after your visit to the  Cancer Center, please contact our office at [phone] between the hours of 8:30 and 4:30 p.m.    Voicemails left after 4:30p.m will not be returned until the following business day.   For prescription refill requests, please have your pharmacy contact us directly.  Please also try to allow 48 hours for prescription requests.   Please contact the scheduling department for questions regarding scheduling.  For scheduling of procedures such as PET scans, CT scans, MRI, Ultrasound, etc please contact central scheduling at [phone].   Resources For Cancer Patients and Caregivers:  American Cancer Society:  800-227-2345  Can help patients locate various types of support and financial assistance Cancer Care: 1-800-813-HOPE (4673) Provides financial assistance, online support groups, medication/co-pay assistance.   [place] DSS:  336-641-3447 Where to apply for food stamps, Medicaid, and utility assistance Medicare Rights Center: 800-333-4114 Helps people with Medicare understand their rights and benefits, navigate the Medicare system, and secure the  quality healthcare they deserve SCAT: 336-333-6589 [place] [employer]'s shared-ride transportation service for eligible riders who have a disability that prevents them from riding the fixed route bus.   For additional information on assistance programs please contact our social worker:   Grier Hock/Abigail Elmore:  [phone] 

## 2016-08-27 ENCOUNTER — Telehealth: Payer: Self-pay

## 2016-08-27 ENCOUNTER — Telehealth: Payer: Self-pay | Admitting: Hematology

## 2016-08-27 NOTE — Telephone Encounter (Signed)
Pt had question about ativan which was clarified. Pt is having heartburn, or food feels like a rock in her stomach. She ate 1/3 of a sandwich last night.  Last night zantac 150 did not help, she took 3 tums and that did help. She does have prilosec at home. We discussed food choices.  What should she take?

## 2016-08-27 NOTE — Telephone Encounter (Signed)
Spoke with patient and confirmed all appointments °

## 2016-08-29 ENCOUNTER — Telehealth: Payer: Self-pay

## 2016-08-29 NOTE — Telephone Encounter (Signed)
Patient query about medication to take for heartburn. Dr. Irene Limbo recommends she take prilosec and tums as needed for breakthrough symptom. Left message on mobile.

## 2016-08-31 NOTE — Progress Notes (Signed)
Marland Kitchen    HEMATOLOGY/ONCOLOGY CLINIC NOTE  Date of Service: .08/26/2016  Patient Care Team: Donald Prose, MD as PCP - General (Family Medicine)  CHIEF COMPLAINTS/PURPOSE OF CONSULTATION:  Newly diagnosed Hodgkins lymphoma  HISTORY OF PRESENTING ILLNESS:   Rhonda Mccann is a wonderful 46 y.o. female who has been referred to [place] by Dr .Donald Prose, MD for evaluation and management of newly diagnosed Hodgkins Lymphoma.  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  Patient first reported noticing a lump in the left side of her neck in January 2018. She noted some mild progressive increase in this lump which appeared more prominent and she reported it to her primary care physician. Patient had an ultrasound of her neck on 07/07/2016 which showed a 1.9 x 0.8 x 1.4 cm lymph node with no distinct fatty hilum. This was subsequently biopsied under ultrasound guidance on 07/21/2016 and findings are consistent with classical Hodgkin's lymphoma nodular sclerosis subtype.  Patient notes no other overtly palpable lymphadenopathy noted. No fevers no chills no night sweats no unexpected weight loss. She reports no lethargy no anorexia. Overall feels quite well.  INTERVAL HISTORY  Patient is here for a toxicity check after having her 1st dose of ABVD. She has some grade 1 nausea (had not taking the Dexamethasone as planed on day 2 and 3 after chemotherapy. Grade 1 mucositis -- resolving. Recommended salt/baking soda gargles and mouthwashes. WBC/ANC is down to 2/0.9. We discussed that we would need to  Consider neulasta if neutropenia worsened on rpt labs with C1D15 chemotherapy. 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 No fever /chills/night sweats or other concerning symptoms at this  time.  MEDICAL HISTORY:  Past Medical History:  Diagnosis Date  . Depression   . Drug abuse 2001  EX alcohol abuse sober for 17 years  Donated left kidney in 2013 (single kidney) Scoliosis GERD  SURGICAL HISTORY: Past Surgical History:  Procedure Laterality Date  . IR FLUORO GUIDE PORT INSERTION RIGHT  08/05/2016  . IR US GUIDE VASC ACCESS RIGHT  08/05/2016  . NEPHRECTOMY Left 2015  . NEPHRECTOMY LIVING DONOR  2013  Breast Augmentation Surgery in 1994 (saline implants)  SOCIAL HISTORY: Social History   Social History  . Marital status: Married    Spouse name: N/A  . Number of children: N/A  . Years of education: N/A   Occupational History  . Not on file.   Social History Main Topics  . Smoking status: Former Smoker    Types: Cigarettes    Quit date: 1998  . Smokeless tobacco: Never Used     Comment: quit 17 yrs ago  . Alcohol use No  . Drug use: No     Comment: Previous drug user - quite 2001  . Sexual activity: Yes   Other Topics Concern  . Not on file   Social History Narrative  . No narrative on file  Works as a Production manager   FAMILY HISTORY: Paternal aunt had breast cancer Paternal grandfather had lymphoma  ALLERGIES:  is allergic to contrast media [iodinated diagnostic agents].  MEDICATIONS:  Current Outpatient Prescriptions  Medication Sig Dispense Refill  . buPROPion (WELLBUTRIN XL) 300 MG 24 hr tablet Take 1 tablet by mouth daily.    Marland Kitchen dexamethasone (DECADRON) 4 MG tablet Take 2 tablets (8  mg total) by mouth See admin instructions. 59m po with breakfast and lunch for 2 days after each chemotherapy 30 tablet 2  . lidocaine-prilocaine (EMLA) cream Apply 1 application topically as needed. 30 g 0  . LORazepam (ATIVAN) 0.5 MG tablet Take 1 tablet (0.5 mg total) by mouth every 8 (eight) hours. 30 tablet 0  . ondansetron (ZOFRAN) 8 MG tablet Take 1 tablet (8 mg total) by mouth every 8 (eight) hours as needed for nausea or vomiting. 20 tablet 0  .  prochlorperazine (COMPAZINE) 10 MG tablet Take 1 tablet (10 mg total) by mouth every 6 (six) hours as needed for nausea or vomiting. 30 tablet 0   No current facility-administered medications for this visit.     REVIEW OF SYSTEMS:    10 Point review of Systems was done is negative except as noted above.  PHYSICAL EXAMINATION: ECOG PERFORMANCE STATUS: 0 - Asymptomatic  . Vitals:   08/26/16 0931  BP: (!) 118/44  Pulse: 95  Resp: 18  Temp: 98.4 F (36.9 C)   Filed Weights   08/26/16 0931  Weight: 202 lb 14.4 oz (92 kg)   .Body mass index is 29.96 kg/m.  GENERAL:alert, in no acute distress and comfortable SKIN: no acute rashes, no significant lesions EYES: conjunctiva are pink and non-injected, sclera anicteric OROPHARYNX: MMM, no exudates, no oropharyngeal erythema or ulceration NECK: supple, no JVD LYMPH: small cervical LNadenopathy in the left posterior neck. no palpable lymphadenopathy in the axillary or inguinal regions LUNGS: clear to auscultation b/l with normal respiratory effort HEART: regular rate & rhythm ABDOMEN:  normoactive bowel sounds , non tender, not distended. Extremity: no pedal edema PSYCH: alert & oriented x 3 with fluent speech NEURO: no focal motor/sensory deficits  LABORATORY DATA:  I have reviewed the data as listed  . CBC Latest Ref Rng & Units 08/26/2016 08/19/2016 08/05/2016  WBC 3.9 - 10.3 10e3/uL 2.0(L) 6.8 8.3  Hemoglobin 11.6 - 15.9 g/dL 13.0 12.8 13.1  Hematocrit 34.8 - 46.6 % 39.8 39.1 38.6  Platelets 145 - 400 10e3/uL 261 324 329   ANC 900  Component     Latest Ref Rng & Units 07/29/2016  WBC     3.9 - 10.3 10e3/uL 8.2  NEUT#     1.5 - 6.5 10e3/uL 5.8  Hemoglobin     11.6 - 15.9 g/dL 13.7  HCT     34.8 - 46.6 % 41.8  Platelets     145 - 400 10e3/uL 338  MCV     79.5 - 101.0 fL 97.4  MCH     25.1 - 34.0 pg 31.9  MCHC     31.5 - 36.0 g/dL 32.8  RBC     3.70 - 5.45 10e6/uL 4.29  RDW     11.2 - 14.5 % 12.7  lymph#      0.9 - 3.3 10e3/uL 1.9  MONO#     0.1 - 0.9 10e3/uL 0.4  Eosinophils Absolute     0.0 - 0.5 10e3/uL 0.1  Basophils Absolute     0.0 - 0.1 10e3/uL 0.0  NEUT%     38.4 - 76.8 % 70.6  LYMPH%     14.0 - 49.7 % 22.9  MONO%     0.0 - 14.0 % 5.2  EOS%     0.0 - 7.0 % 1.1  BASO%     0.0 - 2.0 % 0.2  Retic %     0.70 - 2.10 % 1.94  Retic Ct  Abs     33.70 - 90.70 10e3/uL 83.23  Immature Retic Fract     1.60 - 10.00 % 4.50  Sodium     136 - 145 mEq/L 140  Potassium     3.5 - 5.1 mEq/L 3.6  Chloride     98 - 109 mEq/L 105  CO2     22 - 29 mEq/L 22  Glucose     70 - 140 mg/dl 88  BUN     7.0 - 26.0 mg/dL 8.6  Creatinine     0.6 - 1.1 mg/dL 1.1  Total Bilirubin     0.20 - 1.20 mg/dL 0.38  Alkaline Phosphatase     40 - 150 U/L 105  AST     5 - 34 U/L 14  ALT     0 - 55 U/L 21  Total Protein     6.4 - 8.3 g/dL 8.1  Albumin     3.5 - 5.0 g/dL 4.2  Calcium     8.4 - 10.4 mg/dL 9.7  Anion gap     3 - 11 mEq/L 12 (H)  EGFR     >90 ml/min/1.73 m2 60 (L)  Sed Rate     0 - 32 mm/hr 18  LDH     125 - 245 U/L 166  HIV Screen 4th Generation wRfx     Non Reactive Non Reactive  Hep C Virus Ab     0.0 - 0.9 s/co ratio <0.1  Hepatitis B Surface Ag     Negative Negative  Hep B Core Ab, Tot     Negative Negative        RADIOGRAPHIC STUDIES: I have personally reviewed the radiological images as listed and agreed with the findings in the report. Nm Pet Image Initial (pi) Skull Base To Thigh  Result Date: 08/18/2016 CLINICAL DATA:  Initial treatment strategy for Hodgkin's lymphoma. EXAM: NUCLEAR MEDICINE PET SKULL BASE TO THIGH TECHNIQUE: 10.4 mCi F-18 FDG was injected intravenously. Full-ring PET imaging was performed from the skull base to thigh after the radiotracer. CT data was obtained and used for attenuation correction and anatomic localization. FASTING BLOOD GLUCOSE:  Value: 103 mg/dl COMPARISON:  None. FINDINGS: NECK Enlarged and hypermetabolic neck nodes are  demonstrated. 20 x 10 mm left-sided level 5 lymph node on image number 28 with SUV max 12.4. Moderate hypermetabolism in the tonsillar regions bilaterally with SUV max of 6.8 is concerning for lymphomatous involvement. CHEST Bilateral supraclavicular lymphadenopathy. The largest lymph node on the left side on image number 44 measures 20.5 x 12.5 mm and has an SUV max of 7.0. Index lymph node in the left axilla on image number 53 measures 14.5 x 11.5 mm and has an SUV max of 11.2. Extensive hypermetabolic anterior mediastinal lymphadenopathy with SUV max of 10.5. The largest measurable nodal mass is 4.7 x 2.4 cm on image number 62. No hilar lymphadenopathy. No worrisome pulmonary lesions or acute pulmonary findings. ABDOMEN/PELVIS No abnormal hypermetabolic activity within the liver, pancreas, adrenal glands, or spleen. No hypermetabolic lymph nodes in the abdomen or pelvis. Solitary right kidney is noted. Endometrial activity is likely due to secretory phase of ovulation. SKELETON No focal hypermetabolic activity to suggest skeletal metastasis. IMPRESSION: 1. Hypermetabolic lymphadenopathy in the neck and chest as described above consistent with known Hodgkin's lymphoma. Disease is confined to above the diaphragms. The 2. No evidence of osseous involvement. 3. Solitary right kidney.  Status post left nephrectomy. 4. Endometrial activity, likely due  to secretory phase of ovulation. Electronically Signed   By: Marijo Sanes M.D.   On: 08/18/2016 09:51   Ir US Guide Vasc Access Right  Result Date: 08/05/2016 INDICATION: 46 year old with recently diagnosed Hodgkin's lymphoma. Patient needs Port-A-Cath for treatment. EXAM: FLUOROSCOPIC AND ULTRASOUND GUIDED PLACEMENT OF A SUBCUTANEOUS PORT COMPARISON:  None. MEDICATIONS: Ancef 2 g; The antibiotic was administered within an appropriate time interval prior to skin puncture. ANESTHESIA/SEDATION: Versed 4.0 mg IV; Fentanyl 100 mcg IV; Moderate Sedation Time:  36 minutes  The patient was continuously monitored during the procedure by the interventional radiology nurse under my direct supervision. FLUOROSCOPY TIME:  24 seconds, 4 mGy COMPLICATIONS: None immediate. PROCEDURE: The procedure, risks, benefits, and alternatives were explained to the patient. Questions regarding the procedure were encouraged and answered. The patient understands and consents to the procedure. Patient was placed supine on the interventional table. Ultrasound confirmed a patent right internal jugular vein. The right chest and neck were cleaned with a skin antiseptic and a sterile drape was placed. Maximal barrier sterile technique was utilized including caps, mask, sterile gowns, sterile gloves, sterile drape, hand hygiene and skin antiseptic. The right neck was anesthetized with 1% lidocaine. Small incision was made in the right neck with a blade. Micropuncture set was placed in the right internal jugular vein with ultrasound guidance. The micropuncture wire was used for measurement purposes. The right chest was anesthetized with 1% lidocaine with epinephrine. #15 blade was used to make an incision and a subcutaneous port pocket was formed. [place] was assembled. Subcutaneous tunnel was formed with a stiff tunneling device. The port catheter was brought through the subcutaneous tunnel. The port was placed in the subcutaneous pocket. The micropuncture set was exchanged for a peel-away sheath. The catheter was placed through the peel-away sheath and the tip was positioned at the superior cavoatrial junction. Catheter placement was confirmed with fluoroscopy. The port was accessed and flushed with heparinized saline. The port pocket was closed using two layers of absorbable sutures and Dermabond. The vein skin site was closed using a single layer of absorbable suture and Dermabond. Sterile dressings were applied. Patient tolerated the procedure well without an immediate complication. Ultrasound and  fluoroscopic images were taken and saved for this procedure. IMPRESSION: Placement of a subcutaneous port device. Electronically Signed   By: Markus Daft M.D.   On: 08/05/2016 12:48   Ir Fluoro Guide Port Insertion Right  Result Date: 08/05/2016 INDICATION: 46 year old with recently diagnosed Hodgkin's lymphoma. Patient needs Port-A-Cath for treatment. EXAM: FLUOROSCOPIC AND ULTRASOUND GUIDED PLACEMENT OF A SUBCUTANEOUS PORT COMPARISON:  None. MEDICATIONS: Ancef 2 g; The antibiotic was administered within an appropriate time interval prior to skin puncture. ANESTHESIA/SEDATION: Versed 4.0 mg IV; Fentanyl 100 mcg IV; Moderate Sedation Time:  36 minutes The patient was continuously monitored during the procedure by the interventional radiology nurse under my direct supervision. FLUOROSCOPY TIME:  24 seconds, 4 mGy COMPLICATIONS: None immediate. PROCEDURE: The procedure, risks, benefits, and alternatives were explained to the patient. Questions regarding the procedure were encouraged and answered. The patient understands and consents to the procedure. Patient was placed supine on the interventional table. Ultrasound confirmed a patent right internal jugular vein. The right chest and neck were cleaned with a skin antiseptic and a sterile drape was placed. Maximal barrier sterile technique was utilized including caps, mask, sterile gowns, sterile gloves, sterile drape, hand hygiene and skin antiseptic. The right neck was anesthetized with 1% lidocaine. Small incision was made in  the right neck with a blade. Micropuncture set was placed in the right internal jugular vein with ultrasound guidance. The micropuncture wire was used for measurement purposes. The right chest was anesthetized with 1% lidocaine with epinephrine. #15 blade was used to make an incision and a subcutaneous port pocket was formed. [place] was assembled. Subcutaneous tunnel was formed with a stiff tunneling device. The port catheter was  brought through the subcutaneous tunnel. The port was placed in the subcutaneous pocket. The micropuncture set was exchanged for a peel-away sheath. The catheter was placed through the peel-away sheath and the tip was positioned at the superior cavoatrial junction. Catheter placement was confirmed with fluoroscopy. The port was accessed and flushed with heparinized saline. The port pocket was closed using two layers of absorbable sutures and Dermabond. The vein skin site was closed using a single layer of absorbable suture and Dermabond. Sterile dressings were applied. Patient tolerated the procedure well without an immediate complication. Ultrasound and fluoroscopic images were taken and saved for this procedure. IMPRESSION: Placement of a subcutaneous port device. Electronically Signed   By: Markus Daft M.D.   On: 08/05/2016 12:48    ASSESSMENT & PLAN:   46 yo very pleasant caucasian female with   1) Newly diagnosed Stage IIA (adverse risk due to >3 regions of involvement, non bulky) Classical Hodgkins Lymphoma -Nodular Sclerosis Subtype No constitutional symptoms. Sed rate 18 (WNL) PET/CT reviewed ECHO EF 60-65% DLCO uncorrected 70% of predicted. 24h creatinine clearance WNL.  2) Grade 1 -nausea -patient has prn zofran and compazine. Had not taken the D2 and D3 dexamethasone and made sure she had a prescription for this.  3) Grade 1 mucositis - resolving -discussed the use of cryotherapy during chemotherapy and she was given receipe for salt/baking soda mouthwash  4) Neutropenia from chemotherapy ANC 900. -will need to rpt cbc with diff prior to next ctx in 1 week. If [place] still <1000 will add neulasta Plan -toxicity review done as noted above -no prohibitive toxicities at this time that require overt treatment changes -we shall plan to add Neulasta if ANC still <1000 on labs prior to next treatment. -will plan to treat ABVD x 2 cycles then rpt PET/CT. If [place] 1-2 will treat with  additional 4 cycles of AVD (dropping the bleomycin)  2) Single kidney - patient donated left kidney in 2013 -avoid nephrotoxic medications -24h creatinine creatinine clearance adequate for standard dose chemotherapy. -added contrast and NSAIDS to allergy list  -Please schedule C1,2,3,4 of treatment -labs q2weeks with each treatment -RTC with Dr Irene Limbo on C2D1 with labs   All of the patients questions were answered with apparent satisfaction. The patient knows to call the clinic with any problems, questions or concerns.  I spent 20 minutes counseling the patient face to face. The total time spent in the appointment was 25 minutes and more than 50% was on counseling and direct patient cares.    Sullivan Lone MD [place] AAHIVMS [facility] Hematology/Oncology Physician [facility]  (Office):       [phone] (Work cell):  [phone] (Fax):           [phone]

## 2016-09-01 ENCOUNTER — Other Ambulatory Visit: Payer: Self-pay | Admitting: Hematology

## 2016-09-02 ENCOUNTER — Ambulatory Visit (HOSPITAL_BASED_OUTPATIENT_CLINIC_OR_DEPARTMENT_OTHER): Payer: 59

## 2016-09-02 ENCOUNTER — Encounter: Payer: Self-pay | Admitting: Hematology

## 2016-09-02 ENCOUNTER — Telehealth: Payer: Self-pay

## 2016-09-02 ENCOUNTER — Ambulatory Visit (HOSPITAL_BASED_OUTPATIENT_CLINIC_OR_DEPARTMENT_OTHER): Payer: 59 | Admitting: Hematology

## 2016-09-02 ENCOUNTER — Other Ambulatory Visit (HOSPITAL_BASED_OUTPATIENT_CLINIC_OR_DEPARTMENT_OTHER): Payer: 59

## 2016-09-02 ENCOUNTER — Other Ambulatory Visit: Payer: Self-pay

## 2016-09-02 VITALS — BP 125/75 | HR 80 | Temp 98.1°F | Resp 18 | Ht 69.0 in | Wt 209.2 lb

## 2016-09-02 DIAGNOSIS — K1230 Oral mucositis (ulcerative), unspecified: Secondary | ICD-10-CM

## 2016-09-02 DIAGNOSIS — C8148 Lymphocyte-rich classical Hodgkin lymphoma, lymph nodes of multiple sites: Secondary | ICD-10-CM

## 2016-09-02 DIAGNOSIS — C8111 Nodular sclerosis classical Hodgkin lymphoma, lymph nodes of head, face, and neck: Secondary | ICD-10-CM

## 2016-09-02 DIAGNOSIS — Z905 Acquired absence of kidney: Secondary | ICD-10-CM | POA: Diagnosis not present

## 2016-09-02 DIAGNOSIS — Z5111 Encounter for antineoplastic chemotherapy: Secondary | ICD-10-CM

## 2016-09-02 DIAGNOSIS — D702 Other drug-induced agranulocytosis: Secondary | ICD-10-CM

## 2016-09-02 DIAGNOSIS — C8110 Nodular sclerosis classical Hodgkin lymphoma, unspecified site: Secondary | ICD-10-CM

## 2016-09-02 DIAGNOSIS — D701 Agranulocytosis secondary to cancer chemotherapy: Secondary | ICD-10-CM | POA: Diagnosis not present

## 2016-09-02 LAB — COMPREHENSIVE METABOLIC PANEL
ALBUMIN: 3.5 g/dL (ref 3.5–5.0)
ALT: 23 U/L (ref 0–55)
ANION GAP: 9 meq/L (ref 3–11)
AST: 15 U/L (ref 5–34)
Alkaline Phosphatase: 85 U/L (ref 40–150)
BUN: 5.1 mg/dL — ABNORMAL LOW (ref 7.0–26.0)
CALCIUM: 9.1 mg/dL (ref 8.4–10.4)
CO2: 25 mEq/L (ref 22–29)
Chloride: 107 mEq/L (ref 98–109)
Creatinine: 1.2 mg/dL — ABNORMAL HIGH (ref 0.6–1.1)
EGFR: 54 mL/min/{1.73_m2} — ABNORMAL LOW (ref >90)
Glucose: 101 mg/dl (ref 70–140)
Potassium: 3.9 mEq/L (ref 3.5–5.1)
Sodium: 140 mEq/L (ref 136–145)
TOTAL PROTEIN: 6.9 g/dL (ref 6.4–8.3)
Total Bilirubin: 0.22 mg/dL (ref 0.20–1.20)

## 2016-09-02 LAB — CBC WITH DIFFERENTIAL/PLATELET
BASO%: 0.6 % (ref 0.0–2.0)
BASOS ABS: 0 10*3/uL (ref 0.0–0.1)
EOS%: 2 % (ref 0.0–7.0)
Eosinophils Absolute: 0.1 10*3/uL (ref 0.0–0.5)
HEMATOCRIT: 37.3 % (ref 34.8–46.6)
HGB: 12.6 g/dL (ref 11.6–15.9)
LYMPH#: 2.1 10*3/uL (ref 0.9–3.3)
LYMPH%: 58.8 % — ABNORMAL HIGH (ref 14.0–49.7)
MCH: 31.9 pg (ref 25.1–34.0)
MCHC: 33.7 g/dL (ref 31.5–36.0)
MCV: 94.8 fL (ref 79.5–101.0)
MONO#: 0.7 10*3/uL (ref 0.1–0.9)
MONO%: 20 % — AB (ref 0.0–14.0)
NEUT%: 18.6 % — ABNORMAL LOW (ref 38.4–76.8)
NEUTROS ABS: 0.7 10*3/uL — AB (ref 1.5–6.5)
PLATELETS: 304 10*3/uL (ref 145–400)
RBC: 3.93 10*6/uL (ref 3.70–5.45)
RDW: 12.5 % (ref 11.2–14.5)
WBC: 3.5 10*3/uL — AB (ref 3.9–10.3)

## 2016-09-02 LAB — TECHNOLOGIST REVIEW: Technologist Review: 1

## 2016-09-02 MED ORDER — VINBLASTINE SULFATE CHEMO INJECTION 1 MG/ML
6.0500 mg/m2 | Freq: Once | INTRAVENOUS | Status: AC
  Administered 2016-09-02: 13 mg via INTRAVENOUS
  Filled 2016-09-02: qty 13

## 2016-09-02 MED ORDER — PALONOSETRON HCL INJECTION 0.25 MG/5ML
0.2500 mg | Freq: Once | INTRAVENOUS | Status: AC
  Administered 2016-09-02: 0.25 mg via INTRAVENOUS

## 2016-09-02 MED ORDER — DOXORUBICIN HCL CHEMO IV INJECTION 2 MG/ML
25.0000 mg/m2 | Freq: Once | INTRAVENOUS | Status: AC
  Administered 2016-09-02: 54 mg via INTRAVENOUS
  Filled 2016-09-02: qty 27

## 2016-09-02 MED ORDER — SODIUM CHLORIDE 0.9 % IV SOLN
375.0000 mg/m2 | Freq: Once | INTRAVENOUS | Status: AC
  Administered 2016-09-02: 800 mg via INTRAVENOUS
  Filled 2016-09-02: qty 40

## 2016-09-02 MED ORDER — SODIUM CHLORIDE 0.9 % IV SOLN
Freq: Once | INTRAVENOUS | Status: AC
  Administered 2016-09-02: 13:00:00 via INTRAVENOUS
  Filled 2016-09-02: qty 5

## 2016-09-02 MED ORDER — SODIUM CHLORIDE 0.9 % IV SOLN
Freq: Once | INTRAVENOUS | Status: AC
  Administered 2016-09-02: 12:00:00 via INTRAVENOUS

## 2016-09-02 MED ORDER — TRAMADOL HCL 50 MG PO TABS: 50.0000 mg | ORAL_TABLET | Freq: Four times a day (QID) | ORAL | 0 refills | Status: DC | PRN

## 2016-09-02 MED ORDER — PEGFILGRASTIM 6 MG/0.6ML ~~LOC~~ PSKT: 6.0000 mg | PREFILLED_SYRINGE | Freq: Once | SUBCUTANEOUS | Status: DC

## 2016-09-02 MED ORDER — PALONOSETRON HCL INJECTION 0.25 MG/5ML
INTRAVENOUS | Status: AC
  Filled 2016-09-02: qty 5

## 2016-09-02 MED ORDER — SODIUM CHLORIDE 0.9% FLUSH
10.0000 mL | INTRAVENOUS | Status: DC | PRN
  Administered 2016-09-02: 10 mL
  Filled 2016-09-02: qty 10

## 2016-09-02 MED ORDER — HEPARIN SOD (PORK) LOCK FLUSH 100 UNIT/ML IV SOLN
500.0000 [IU] | Freq: Once | INTRAVENOUS | Status: AC | PRN
  Administered 2016-09-02: 500 [IU]
  Filled 2016-09-02: qty 5

## 2016-09-02 MED ORDER — BLEOMYCIN SULFATE CHEMO INJECTION 30 UNIT
10.0000 [IU]/m2 | Freq: Once | INTRAMUSCULAR | Status: AC
  Administered 2016-09-02: 22 [IU] via INTRAVENOUS
  Filled 2016-09-02: qty 7.33

## 2016-09-02 NOTE — Progress Notes (Signed)
Met w/ pt to introduce myself as her Financial Resource Specialist.  Informed her unfortunately there aren't any foundations offering copay assistance for her Dx but offered the [facility] grant.  She exceeds the income requirement so she doesn't qualify but she has my card to contact me with any questions or concerns she may have in the future.  °

## 2016-09-02 NOTE — Telephone Encounter (Signed)
appts were already in place,pet order was verified and avs and calendars were printed for the patient

## 2016-09-02 NOTE — Progress Notes (Signed)
Marland Kitchen    HEMATOLOGY/ONCOLOGY CLINIC NOTE  Date of Service: .09/02/2016  Patient Care Team: Donald Prose, MD as PCP - General (Family Medicine)  CHIEF COMPLAINTS/PURPOSE OF CONSULTATION:  Newly diagnosed Hodgkins lymphoma  HISTORY OF PRESENTING ILLNESS:   Rhonda Mccann is a wonderful 46 y.o. female who has been referred to [place] by Dr .Donald Prose, MD for evaluation and management of newly diagnosed Hodgkins Lymphoma.  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  Patient first reported noticing a lump in the left side of her neck in January 2018. She noted some mild progressive increase in this lump which appeared more prominent and she reported it to her primary care physician. Patient had an ultrasound of her neck on 07/07/2016 which showed a 1.9 x 0.8 x 1.4 cm lymph node with no distinct fatty hilum. This was subsequently biopsied under ultrasound guidance on 07/21/2016 and findings are consistent with classical Hodgkin's lymphoma nodular sclerosis subtype.  Patient notes no other overtly palpable lymphadenopathy noted. No fevers no chills no night sweats no unexpected weight loss. She reports no lethargy no anorexia. Overall feels quite well.  INTERVAL HISTORY  Patient is here for a follow-up prior to cycle 1 day 15 of ABVD. She her nausea and mucositis have resolved .some generalized body aches . Hemoglobin and platelets stable. Total WBC counts are better at 3.5 k but her [place] is 700 . We discussed adding Neulasta onpro which she is agreeable with - this has been ordered. She was given when necessary tramadol to use for possible Neulasta-related bone pains and was recommended daily over-the-counter nonsedating antihistamine such as loratadine daily for 5-7 days after each chemotherapy to decrease Neulasta associated bone pains. No fever /chills/night sweats or other  concerning symptoms at this time.  MEDICAL HISTORY:  Past Medical History:  Diagnosis Date  . Depression   . Drug abuse 2001  EX alcohol abuse sober for 17 years  Donated left kidney in 2013 (single kidney) Scoliosis GERD  SURGICAL HISTORY: Past Surgical History:  Procedure Laterality Date  . IR FLUORO GUIDE PORT INSERTION RIGHT  08/05/2016  . IR US GUIDE VASC ACCESS RIGHT  08/05/2016  . NEPHRECTOMY Left 2015  . NEPHRECTOMY LIVING DONOR  2013  Breast Augmentation Surgery in 1994 (saline implants)  SOCIAL HISTORY: Social History   Social History  . Marital status: Married    Spouse name: N/A  . Number of children: N/A  . Years of education: N/A   Occupational History  . Not on file.   Social History Main Topics  . Smoking status: Former Smoker    Types: Cigarettes    Quit date: 1998  . Smokeless tobacco: Never Used     Comment: quit 17 yrs ago  . Alcohol use No  . Drug use: No     Comment: Previous drug user - quite 2001  . Sexual activity: Yes   Other Topics Concern  . Not on file   Social History Narrative  . No narrative on file  Works as a Production manager   FAMILY HISTORY: Paternal aunt had breast cancer Paternal grandfather had lymphoma  ALLERGIES:  is allergic to contrast media [iodinated diagnostic agents] and nsaids.  MEDICATIONS:  Current Outpatient Prescriptions  Medication Sig Dispense Refill  . buPROPion (WELLBUTRIN XL) 300 MG 24 hr tablet Take 1 tablet by mouth daily.    Marland Kitchen dexamethasone (  DECADRON) 4 MG tablet Take 2 tablets (8 mg total) by mouth See admin instructions. 62m po with breakfast and lunch for 2 days after each chemotherapy 30 tablet 2  . lidocaine-prilocaine (EMLA) cream Apply 1 application topically as needed. 30 g 0  . LORazepam (ATIVAN) 0.5 MG tablet Take 1 tablet (0.5 mg total) by mouth every 8 (eight) hours. 30 tablet 0  . ondansetron (ZOFRAN) 8 MG tablet Take 1 tablet (8 mg total) by mouth every 8 (eight) hours as needed for  nausea or vomiting. 20 tablet 0  . prochlorperazine (COMPAZINE) 10 MG tablet Take 1 tablet (10 mg total) by mouth every 6 (six) hours as needed for nausea or vomiting. 30 tablet 0  . traMADol (ULTRAM) 50 MG tablet Take 1 tablet (50 mg total) by mouth every 6 (six) hours as needed. 30 tablet 0   No current facility-administered medications for this visit.     REVIEW OF SYSTEMS:    10 Point review of Systems was done is negative except as noted above.  PHYSICAL EXAMINATION: ECOG PERFORMANCE STATUS: 0 - Asymptomatic  . Vitals:   09/02/16 1044  BP: 125/75  Pulse: 80  Resp: 18  Temp: 98.1 F (36.7 C)   Filed Weights   09/02/16 1044  Weight: 209 lb 3.2 oz (94.9 kg)   .Body mass index is 30.89 kg/m.  GENERAL:alert, in no acute distress and comfortable SKIN: no acute rashes, no significant lesions EYES: conjunctiva are pink and non-injected, sclera anicteric OROPHARYNX: MMM, no exudates, no oropharyngeal erythema or ulceration NECK: supple, no JVD LYMPH: small cervical LNadenopathy in the left posterior neck. no palpable lymphadenopathy in the axillary or inguinal regions LUNGS: clear to auscultation b/l with normal respiratory effort HEART: regular rate & rhythm ABDOMEN:  normoactive bowel sounds , non tender, not distended. Extremity: no pedal edema PSYCH: alert & oriented x 3 with fluent speech NEURO: no focal motor/sensory deficits  LABORATORY DATA:  I have reviewed the data as listed  . CBC Latest Ref Rng & Units 09/02/2016 08/26/2016 08/19/2016  WBC 3.9 - 10.3 10e3/uL 3.5(L) 2.0(L) 6.8  Hemoglobin 11.6 - 15.9 g/dL 12.6 13.0 12.8  Hematocrit 34.8 - 46.6 % 37.3 39.8 39.1  Platelets 145 - 400 10e3/uL 304 261 324   ANC 700 . CMP Latest Ref Rng & Units 09/02/2016 08/26/2016 08/19/2016  Glucose 70 - 140 mg/dl 101 97 85  BUN 7.0 - 26.0 mg/dL 5.1(L) 9.1 12.8  Creatinine 0.6 - 1.1 mg/dL 1.2(H) 1.1 1.2(H)  Sodium 136 - 145 mEq/L 140 138 142  Potassium 3.5 - 5.1 mEq/L 3.9 4.0  4.2  CO2 22 - 29 mEq/L '25 24 24  ' Calcium 8.4 - 10.4 mg/dL 9.1 9.4 9.6  Total Protein 6.4 - 8.3 g/dL 6.9 7.6 7.3  Total Bilirubin 0.20 - 1.20 mg/dL 0.22 0.60 0.28  Alkaline Phos 40 - 150 U/L 85 77 91  AST 5 - 34 U/L '15 19 12  ' ALT 0 - 55 U/L '23 27 14     ' Component     Latest Ref Rng & Units 07/29/2016  WBC     3.9 - 10.3 10e3/uL 8.2  NEUT#     1.5 - 6.5 10e3/uL 5.8  Hemoglobin     11.6 - 15.9 g/dL 13.7  HCT     34.8 - 46.6 % 41.8  Platelets     145 - 400 10e3/uL 338  MCV     79.5 - 101.0 fL 97.4  MCH  25.1 - 34.0 pg 31.9  MCHC     31.5 - 36.0 g/dL 32.8  RBC     3.70 - 5.45 10e6/uL 4.29  RDW     11.2 - 14.5 % 12.7  lymph#     0.9 - 3.3 10e3/uL 1.9  MONO#     0.1 - 0.9 10e3/uL 0.4  Eosinophils Absolute     0.0 - 0.5 10e3/uL 0.1  Basophils Absolute     0.0 - 0.1 10e3/uL 0.0  NEUT%     38.4 - 76.8 % 70.6  LYMPH%     14.0 - 49.7 % 22.9  MONO%     0.0 - 14.0 % 5.2  EOS%     0.0 - 7.0 % 1.1  BASO%     0.0 - 2.0 % 0.2  Retic %     0.70 - 2.10 % 1.94  Retic Ct Abs     33.70 - 90.70 10e3/uL 83.23  Immature Retic Fract     1.60 - 10.00 % 4.50  Sodium     136 - 145 mEq/L 140  Potassium     3.5 - 5.1 mEq/L 3.6  Chloride     98 - 109 mEq/L 105  CO2     22 - 29 mEq/L 22  Glucose     70 - 140 mg/dl 88  BUN     7.0 - 26.0 mg/dL 8.6  Creatinine     0.6 - 1.1 mg/dL 1.1  Total Bilirubin     0.20 - 1.20 mg/dL 0.38  Alkaline Phosphatase     40 - 150 U/L 105  AST     5 - 34 U/L 14  ALT     0 - 55 U/L 21  Total Protein     6.4 - 8.3 g/dL 8.1  Albumin     3.5 - 5.0 g/dL 4.2  Calcium     8.4 - 10.4 mg/dL 9.7  Anion gap     3 - 11 mEq/L 12 (H)  EGFR     >90 ml/min/1.73 m2 60 (L)  Sed Rate     0 - 32 mm/hr 18  LDH     125 - 245 U/L 166  HIV Screen 4th Generation wRfx     Non Reactive Non Reactive  Hep C Virus Ab     0.0 - 0.9 s/co ratio <0.1  Hepatitis B Surface Ag     Negative Negative  Hep B Core Ab, Tot     Negative Negative         RADIOGRAPHIC STUDIES: I have personally reviewed the radiological images as listed and agreed with the findings in the report. Nm Pet Image Initial (pi) Skull Base To Thigh  Result Date: 08/18/2016 CLINICAL DATA:  Initial treatment strategy for Hodgkin's lymphoma. EXAM: NUCLEAR MEDICINE PET SKULL BASE TO THIGH TECHNIQUE: 10.4 mCi F-18 FDG was injected intravenously. Full-ring PET imaging was performed from the skull base to thigh after the radiotracer. CT data was obtained and used for attenuation correction and anatomic localization. FASTING BLOOD GLUCOSE:  Value: 103 mg/dl COMPARISON:  None. FINDINGS: NECK Enlarged and hypermetabolic neck nodes are demonstrated. 20 x 10 mm left-sided level 5 lymph node on image number 28 with SUV max 12.4. Moderate hypermetabolism in the tonsillar regions bilaterally with SUV max of 6.8 is concerning for lymphomatous involvement. CHEST Bilateral supraclavicular lymphadenopathy. The largest lymph node on the left side on image number 44 measures 20.5 x 12.5 mm and has  an SUV max of 7.0. Index lymph node in the left axilla on image number 53 measures 14.5 x 11.5 mm and has an SUV max of 11.2. Extensive hypermetabolic anterior mediastinal lymphadenopathy with SUV max of 10.5. The largest measurable nodal mass is 4.7 x 2.4 cm on image number 62. No hilar lymphadenopathy. No worrisome pulmonary lesions or acute pulmonary findings. ABDOMEN/PELVIS No abnormal hypermetabolic activity within the liver, pancreas, adrenal glands, or spleen. No hypermetabolic lymph nodes in the abdomen or pelvis. Solitary right kidney is noted. Endometrial activity is likely due to secretory phase of ovulation. SKELETON No focal hypermetabolic activity to suggest skeletal metastasis. IMPRESSION: 1. Hypermetabolic lymphadenopathy in the neck and chest as described above consistent with known Hodgkin's lymphoma. Disease is confined to above the diaphragms. The 2. No evidence of osseous  involvement. 3. Solitary right kidney.  Status post left nephrectomy. 4. Endometrial activity, likely due to secretory phase of ovulation. Electronically Signed   By: Marijo Sanes M.D.   On: 08/18/2016 09:51   Ir US Guide Vasc Access Right  Result Date: 08/05/2016 INDICATION: 46 year old with recently diagnosed Hodgkin's lymphoma. Patient needs Port-A-Cath for treatment. EXAM: FLUOROSCOPIC AND ULTRASOUND GUIDED PLACEMENT OF A SUBCUTANEOUS PORT COMPARISON:  None. MEDICATIONS: Ancef 2 g; The antibiotic was administered within an appropriate time interval prior to skin puncture. ANESTHESIA/SEDATION: Versed 4.0 mg IV; Fentanyl 100 mcg IV; Moderate Sedation Time:  36 minutes The patient was continuously monitored during the procedure by the interventional radiology nurse under my direct supervision. FLUOROSCOPY TIME:  24 seconds, 4 mGy COMPLICATIONS: None immediate. PROCEDURE: The procedure, risks, benefits, and alternatives were explained to the patient. Questions regarding the procedure were encouraged and answered. The patient understands and consents to the procedure. Patient was placed supine on the interventional table. Ultrasound confirmed a patent right internal jugular vein. The right chest and neck were cleaned with a skin antiseptic and a sterile drape was placed. Maximal barrier sterile technique was utilized including caps, mask, sterile gowns, sterile gloves, sterile drape, hand hygiene and skin antiseptic. The right neck was anesthetized with 1% lidocaine. Small incision was made in the right neck with a blade. Micropuncture set was placed in the right internal jugular vein with ultrasound guidance. The micropuncture wire was used for measurement purposes. The right chest was anesthetized with 1% lidocaine with epinephrine. #15 blade was used to make an incision and a subcutaneous port pocket was formed. [place] was assembled. Subcutaneous tunnel was formed with a stiff tunneling device.  The port catheter was brought through the subcutaneous tunnel. The port was placed in the subcutaneous pocket. The micropuncture set was exchanged for a peel-away sheath. The catheter was placed through the peel-away sheath and the tip was positioned at the superior cavoatrial junction. Catheter placement was confirmed with fluoroscopy. The port was accessed and flushed with heparinized saline. The port pocket was closed using two layers of absorbable sutures and Dermabond. The vein skin site was closed using a single layer of absorbable suture and Dermabond. Sterile dressings were applied. Patient tolerated the procedure well without an immediate complication. Ultrasound and fluoroscopic images were taken and saved for this procedure. IMPRESSION: Placement of a subcutaneous port device. Electronically Signed   By: Markus Daft M.D.   On: 08/05/2016 12:48   Ir Fluoro Guide Port Insertion Right  Result Date: 08/05/2016 INDICATION: 45 year old with recently diagnosed Hodgkin's lymphoma. Patient needs Port-A-Cath for treatment. EXAM: FLUOROSCOPIC AND ULTRASOUND GUIDED PLACEMENT OF A SUBCUTANEOUS PORT COMPARISON:  None. MEDICATIONS: Ancef 2 g; The antibiotic was administered within an appropriate time interval prior to skin puncture. ANESTHESIA/SEDATION: Versed 4.0 mg IV; Fentanyl 100 mcg IV; Moderate Sedation Time:  36 minutes The patient was continuously monitored during the procedure by the interventional radiology nurse under my direct supervision. FLUOROSCOPY TIME:  24 seconds, 4 mGy COMPLICATIONS: None immediate. PROCEDURE: The procedure, risks, benefits, and alternatives were explained to the patient. Questions regarding the procedure were encouraged and answered. The patient understands and consents to the procedure. Patient was placed supine on the interventional table. Ultrasound confirmed a patent right internal jugular vein. The right chest and neck were cleaned with a skin antiseptic and a sterile drape  was placed. Maximal barrier sterile technique was utilized including caps, mask, sterile gowns, sterile gloves, sterile drape, hand hygiene and skin antiseptic. The right neck was anesthetized with 1% lidocaine. Small incision was made in the right neck with a blade. Micropuncture set was placed in the right internal jugular vein with ultrasound guidance. The micropuncture wire was used for measurement purposes. The right chest was anesthetized with 1% lidocaine with epinephrine. #15 blade was used to make an incision and a subcutaneous port pocket was formed. [place] was assembled. Subcutaneous tunnel was formed with a stiff tunneling device. The port catheter was brought through the subcutaneous tunnel. The port was placed in the subcutaneous pocket. The micropuncture set was exchanged for a peel-away sheath. The catheter was placed through the peel-away sheath and the tip was positioned at the superior cavoatrial junction. Catheter placement was confirmed with fluoroscopy. The port was accessed and flushed with heparinized saline. The port pocket was closed using two layers of absorbable sutures and Dermabond. The vein skin site was closed using a single layer of absorbable suture and Dermabond. Sterile dressings were applied. Patient tolerated the procedure well without an immediate complication. Ultrasound and fluoroscopic images were taken and saved for this procedure. IMPRESSION: Placement of a subcutaneous port device. Electronically Signed   By: Markus Daft M.D.   On: 08/05/2016 12:48    ASSESSMENT & PLAN:   46 yo very pleasant caucasian female with   1) Newly diagnosed Stage IIA (adverse risk due to >3 regions of involvement, non bulky) Classical Hodgkins Lymphoma -Nodular Sclerosis Subtype No constitutional symptoms. Sed rate 18 (WNL) PET/CT reviewed ECHO EF 60-65% DLCO uncorrected 70% of predicted. 24h creatinine clearance WNL.  2) Grade 1 -nausea -resolved -patient has prn  zofran and compazine. Had not taken the D2 and D3 dexamethasone and made sure she had a prescription for this. Reminded her again to take this.  3) Grade 1 mucositis - resolving -discussed the use of cryotherapy during chemotherapy and she was given receipe for salt/baking soda mouthwash  4) Neutropenia from chemotherapy ANC 700. -Will add Neulasta onpro from the cycle.  Plan -Labs reviewed today with the patient. -Given persistent neutropenia will add Neulasta onpro from the cycle. -Recommended taking daily loratadine for 5-7 days with each cycle to reduce Neulasta-related bone pains. -Given a prescription for when necessary tramadol for possible Neulasta-related bone pains. -no prohibitive toxicities at this time that require overt treatment changes -will plan to treat ABVD x 2 cycles then rpt PET/CT - PET/CT scan ordered for around 10/07/2016. - If [place] 1-2 will treat with additional 4 cycles of AVD (dropping the bleomycin)  2) Single kidney - patient donated left kidney in 2013 -avoid nephrotoxic medications -Creatinine stable  -continune ABVD as per schedule -PET/CT in 5  weeks around 10/07/2016 (about a week prior to C3D1) -RTC with Dr Irene Limbo in 2 weeks with labs  All of the patients questions were answered with apparent satisfaction. The patient knows to call the clinic with any problems, questions or concerns.  I spent 20 minutes counseling the patient face to face. The total time spent in the appointment was 25 minutes and more than 50% was on counseling and direct patient cares.    Sullivan Lone MD [place] AAHIVMS [facility] Hematology/Oncology Physician [facility]  (Office):       [phone] (Work cell):  [phone] (Fax):           [phone]

## 2016-09-02 NOTE — Patient Instructions (Signed)
[place] Discharge Instructions for Patients Receiving Chemotherapy  Today you received the following chemotherapy agents: Adriamycin, Vinblastine, Bleomycin, and Dacarbazine (DTIC).  To help prevent nausea and vomiting after your treatment, we encourage you to take your nausea medication as instructed.   If you develop nausea and vomiting that is not controlled by your nausea medication, call the clinic.   BELOW ARE SYMPTOMS THAT SHOULD BE REPORTED IMMEDIATELY:  *FEVER GREATER THAN 100.5 F  *CHILLS WITH OR WITHOUT FEVER  NAUSEA AND VOMITING THAT IS NOT CONTROLLED WITH YOUR NAUSEA MEDICATION  *UNUSUAL SHORTNESS OF BREATH  *UNUSUAL BRUISING OR BLEEDING  TENDERNESS IN MOUTH AND THROAT WITH OR WITHOUT PRESENCE OF ULCERS  *URINARY PROBLEMS  *BOWEL PROBLEMS  UNUSUAL RASH Items with * indicate a potential emergency and should be followed up as soon as possible.  Feel free to call the clinic you have any questions or concerns. The clinic phone number is [phone].  Please show the [place] at check-in to the Emergency Department and triage nurse.  Pegfilgrastim injection What is this medicine? PEGFILGRASTIM (PEG fil gra stim) is a long-acting granulocyte colony-stimulating factor that stimulates the growth of neutrophils, a type of white blood cell important in the body's fight against infection. It is used to reduce the incidence of fever and infection in patients with certain types of cancer who are receiving chemotherapy that affects the bone marrow, and to increase survival after being exposed to high doses of radiation. This medicine may be used for other purposes; ask your health care provider or pharmacist if you have questions. COMMON BRAND NAME(S): Neulasta What should I tell my health care provider before I take this medicine? They need to know if you have any of these conditions: -kidney disease -latex allergy -ongoing radiation  therapy -sickle cell disease -skin reactions to acrylic adhesives (On-Body Injector only) -an unusual or allergic reaction to pegfilgrastim, filgrastim, other medicines, foods, dyes, or preservatives -pregnant or trying to get pregnant -breast-feeding How should I use this medicine? This medicine is for injection under the skin. If you get this medicine at home, you will be taught how to prepare and give the pre-filled syringe or how to use the On-body Injector. Refer to the patient Instructions for Use for detailed instructions. Use exactly as directed. Tell your healthcare provider immediately if you suspect that the On-body Injector may not have performed as intended or if you suspect the use of the On-body Injector resulted in a missed or partial dose. It is important that you put your used needles and syringes in a special sharps container. Do not put them in a trash can. If you do not have a sharps container, call your pharmacist or healthcare provider to get one. Talk to your pediatrician regarding the use of this medicine in children. While this drug may be prescribed for selected conditions, precautions do apply. Overdosage: If you think you have taken too much of this medicine contact a poison control center or emergency room at once. NOTE: This medicine is only for you. Do not share this medicine with others. What if I miss a dose? It is important not to miss your dose. Call your doctor or health care professional if you miss your dose. If you miss a dose due to an On-body Injector failure or leakage, a new dose should be administered as soon as possible using a single prefilled syringe for manual use. What may interact with this medicine? Interactions have not been  studied. Give your health care provider a list of all the medicines, herbs, non-prescription drugs, or dietary supplements you use. Also tell them if you smoke, drink alcohol, or use illegal drugs. Some items may interact with  your medicine. This list may not describe all possible interactions. Give your health care provider a list of all the medicines, herbs, non-prescription drugs, or dietary supplements you use. Also tell them if you smoke, drink alcohol, or use illegal drugs. Some items may interact with your medicine. What should I watch for while using this medicine? You may need blood work done while you are taking this medicine. If you are going to need a MRI, CT scan, or other procedure, tell your doctor that you are using this medicine (On-Body Injector only). What side effects may I notice from receiving this medicine? Side effects that you should report to your doctor or health care professional as soon as possible: -allergic reactions like skin rash, itching or hives, swelling of the face, lips, or tongue -dizziness -fever -pain, redness, or irritation at site where injected -pinpoint red spots on the skin -red or dark-brown urine -shortness of breath or breathing problems -stomach or side pain, or pain at the shoulder -swelling -tiredness -trouble passing urine or change in the amount of urine Side effects that usually do not require medical attention (report to your doctor or health care professional if they continue or are bothersome): -bone pain -muscle pain This list may not describe all possible side effects. Call your doctor for medical advice about side effects. You may report side effects to FDA at 1-800-FDA-1088. Where should I keep my medicine? Keep out of the reach of children. Store pre-filled syringes in a refrigerator between 2 and 8 degrees C (36 and 46 degrees F). Do not freeze. Keep in carton to protect from light. Throw away this medicine if it is left out of the refrigerator for more than 48 hours. Throw away any unused medicine after the expiration date. NOTE: This sheet is a summary. It may not cover all possible information. If you have questions about this medicine, talk to  your doctor, pharmacist, or health care provider.  2018 Elsevier/Gold Standard (2016-01-17 12:58:03)

## 2016-09-02 NOTE — Progress Notes (Signed)
Per Dr. Irene Limbo, okay to tx with ANC 0.7. Neulasta Onpro added to plan.

## 2016-09-04 ENCOUNTER — Ambulatory Visit (HOSPITAL_BASED_OUTPATIENT_CLINIC_OR_DEPARTMENT_OTHER): Payer: 59

## 2016-09-04 VITALS — BP 121/73 | HR 78 | Temp 97.9°F | Resp 20

## 2016-09-04 DIAGNOSIS — C8111 Nodular sclerosis classical Hodgkin lymphoma, lymph nodes of head, face, and neck: Secondary | ICD-10-CM

## 2016-09-04 DIAGNOSIS — D701 Agranulocytosis secondary to cancer chemotherapy: Secondary | ICD-10-CM | POA: Diagnosis not present

## 2016-09-04 DIAGNOSIS — C8148 Lymphocyte-rich classical Hodgkin lymphoma, lymph nodes of multiple sites: Secondary | ICD-10-CM

## 2016-09-04 MED ORDER — PEGFILGRASTIM INJECTION 6 MG/0.6ML ~~LOC~~
6.0000 mg | PREFILLED_SYRINGE | Freq: Once | SUBCUTANEOUS | Status: AC
  Administered 2016-09-04: 6 mg via SUBCUTANEOUS
  Filled 2016-09-04: qty 0.6

## 2016-09-04 NOTE — Patient Instructions (Signed)
Pegfilgrastim injection What is this medicine? PEGFILGRASTIM (PEG fil gra stim) is a long-acting granulocyte colony-stimulating factor that stimulates the growth of neutrophils, a type of white blood cell important in the body's fight against infection. It is used to reduce the incidence of fever and infection in patients with certain types of cancer who are receiving chemotherapy that affects the bone marrow, and to increase survival after being exposed to high doses of radiation. This medicine may be used for other purposes; ask your health care provider or pharmacist if you have questions. COMMON BRAND NAME(S): Neulasta What should I tell my health care provider before I take this medicine? They need to know if you have any of these conditions: -kidney disease -latex allergy -ongoing radiation therapy -sickle cell disease -skin reactions to acrylic adhesives (On-Body Injector only) -an unusual or allergic reaction to pegfilgrastim, filgrastim, other medicines, foods, dyes, or preservatives -pregnant or trying to get pregnant -breast-feeding How should I use this medicine? This medicine is for injection under the skin. If you get this medicine at home, you will be taught how to prepare and give the pre-filled syringe or how to use the On-body Injector. Refer to the patient Instructions for Use for detailed instructions. Use exactly as directed. Tell your healthcare provider immediately if you suspect that the On-body Injector may not have performed as intended or if you suspect the use of the On-body Injector resulted in a missed or partial dose. It is important that you put your used needles and syringes in a special sharps container. Do not put them in a trash can. If you do not have a sharps container, call your pharmacist or healthcare provider to get one. Talk to your pediatrician regarding the use of this medicine in children. While this drug may be prescribed for selected conditions,  precautions do apply. Overdosage: If you think you have taken too much of this medicine contact a poison control center or emergency room at once. NOTE: This medicine is only for you. Do not share this medicine with others. What if I miss a dose? It is important not to miss your dose. Call your doctor or health care professional if you miss your dose. If you miss a dose due to an On-body Injector failure or leakage, a new dose should be administered as soon as possible using a single prefilled syringe for manual use. What may interact with this medicine? Interactions have not been studied. Give your health care provider a list of all the medicines, herbs, non-prescription drugs, or dietary supplements you use. Also tell them if you smoke, drink alcohol, or use illegal drugs. Some items may interact with your medicine. This list may not describe all possible interactions. Give your health care provider a list of all the medicines, herbs, non-prescription drugs, or dietary supplements you use. Also tell them if you smoke, drink alcohol, or use illegal drugs. Some items may interact with your medicine. What should I watch for while using this medicine? You may need blood work done while you are taking this medicine. If you are going to need a MRI, CT scan, or other procedure, tell your doctor that you are using this medicine (On-Body Injector only). What side effects may I notice from receiving this medicine? Side effects that you should report to your doctor or health care professional as soon as possible: -allergic reactions like skin rash, itching or hives, swelling of the face, lips, or tongue -dizziness -fever -pain, redness, or irritation at site   where injected -pinpoint red spots on the skin -red or dark-brown urine -shortness of breath or breathing problems -stomach or side pain, or pain at the shoulder -swelling -tiredness -trouble passing urine or change in the amount of urine Side  effects that usually do not require medical attention (report to your doctor or health care professional if they continue or are bothersome): -bone pain -muscle pain This list may not describe all possible side effects. Call your doctor for medical advice about side effects. You may report side effects to FDA at 1-800-FDA-1088. Where should I keep my medicine? Keep out of the reach of children. Store pre-filled syringes in a refrigerator between 2 and 8 degrees C (36 and 46 degrees F). Do not freeze. Keep in carton to protect from light. Throw away this medicine if it is left out of the refrigerator for more than 48 hours. Throw away any unused medicine after the expiration date. NOTE: This sheet is a summary. It may not cover all possible information. If you have questions about this medicine, talk to your doctor, pharmacist, or health care provider.  2018 Elsevier/Gold Standard (2016-01-17 12:58:03)  

## 2016-09-09 ENCOUNTER — Encounter: Payer: Self-pay | Admitting: Hematology

## 2016-09-09 ENCOUNTER — Other Ambulatory Visit: Payer: Self-pay | Admitting: Hematology

## 2016-09-09 MED ORDER — HYDROCODONE-ACETAMINOPHEN 5-325 MG PO TABS: 1.0000 | ORAL_TABLET | Freq: Four times a day (QID) | ORAL | 0 refills | Status: DC | PRN

## 2016-09-16 ENCOUNTER — Other Ambulatory Visit (HOSPITAL_BASED_OUTPATIENT_CLINIC_OR_DEPARTMENT_OTHER): Payer: 59

## 2016-09-16 ENCOUNTER — Ambulatory Visit: Payer: 59

## 2016-09-16 ENCOUNTER — Encounter: Payer: Self-pay | Admitting: Hematology

## 2016-09-16 ENCOUNTER — Other Ambulatory Visit: Payer: 59

## 2016-09-16 ENCOUNTER — Ambulatory Visit (HOSPITAL_BASED_OUTPATIENT_CLINIC_OR_DEPARTMENT_OTHER): Payer: 59 | Admitting: Hematology

## 2016-09-16 ENCOUNTER — Ambulatory Visit (HOSPITAL_BASED_OUTPATIENT_CLINIC_OR_DEPARTMENT_OTHER): Payer: 59

## 2016-09-16 ENCOUNTER — Other Ambulatory Visit: Payer: Self-pay | Admitting: *Deleted

## 2016-09-16 VITALS — BP 112/64 | HR 80 | Temp 97.9°F | Resp 18 | Ht 69.0 in | Wt 205.2 lb

## 2016-09-16 DIAGNOSIS — M898X9 Other specified disorders of bone, unspecified site: Secondary | ICD-10-CM

## 2016-09-16 DIAGNOSIS — D702 Other drug-induced agranulocytosis: Secondary | ICD-10-CM

## 2016-09-16 DIAGNOSIS — C8111 Nodular sclerosis classical Hodgkin lymphoma, lymph nodes of head, face, and neck: Secondary | ICD-10-CM | POA: Diagnosis not present

## 2016-09-16 DIAGNOSIS — Z905 Acquired absence of kidney: Secondary | ICD-10-CM

## 2016-09-16 DIAGNOSIS — C8148 Lymphocyte-rich classical Hodgkin lymphoma, lymph nodes of multiple sites: Secondary | ICD-10-CM

## 2016-09-16 DIAGNOSIS — Z5111 Encounter for antineoplastic chemotherapy: Secondary | ICD-10-CM

## 2016-09-16 DIAGNOSIS — C8110 Nodular sclerosis classical Hodgkin lymphoma, unspecified site: Secondary | ICD-10-CM

## 2016-09-16 LAB — CBC & DIFF AND RETIC
BASO%: 0.5 % (ref 0.0–2.0)
BASOS ABS: 0.1 10*3/uL (ref 0.0–0.1)
EOS ABS: 0 10*3/uL (ref 0.0–0.5)
EOS%: 0.2 % (ref 0.0–7.0)
HCT: 36.8 % (ref 34.8–46.6)
HEMOGLOBIN: 12.2 g/dL (ref 11.6–15.9)
IMMATURE RETIC FRACT: 19.3 % — AB (ref 1.60–10.00)
LYMPH%: 25.9 % (ref 14.0–49.7)
MCH: 31.9 pg (ref 25.1–34.0)
MCHC: 33.2 g/dL (ref 31.5–36.0)
MCV: 96.1 fL (ref 79.5–101.0)
MONO#: 0.5 10*3/uL (ref 0.1–0.9)
MONO%: 5.1 % (ref 0.0–14.0)
NEUT%: 68.3 % (ref 38.4–76.8)
NEUTROS ABS: 6.4 10*3/uL (ref 1.5–6.5)
PLATELETS: 219 10*3/uL (ref 145–400)
RBC: 3.83 10*6/uL (ref 3.70–5.45)
RDW: 13.5 % (ref 11.2–14.5)
Retic %: 3.07 % — ABNORMAL HIGH (ref 0.70–2.10)
Retic Ct Abs: 117.58 10*3/uL — ABNORMAL HIGH (ref 33.70–90.70)
WBC: 9.4 10*3/uL (ref 3.9–10.3)
lymph#: 2.4 10*3/uL (ref 0.9–3.3)
nRBC: 0 % (ref 0–0)

## 2016-09-16 LAB — COMPREHENSIVE METABOLIC PANEL
ALT: 23 U/L (ref 0–55)
ANION GAP: 8 meq/L (ref 3–11)
AST: 15 U/L (ref 5–34)
Albumin: 3.6 g/dL (ref 3.5–5.0)
Alkaline Phosphatase: 93 U/L (ref 40–150)
BUN: 10.5 mg/dL (ref 7.0–26.0)
CHLORIDE: 107 meq/L (ref 98–109)
CO2: 25 meq/L (ref 22–29)
CREATININE: 1.2 mg/dL — AB (ref 0.6–1.1)
Calcium: 9.2 mg/dL (ref 8.4–10.4)
EGFR: 57 mL/min/{1.73_m2} — AB (ref >90)
GLUCOSE: 93 mg/dL (ref 70–140)
Potassium: 4 mEq/L (ref 3.5–5.1)
SODIUM: 139 meq/L (ref 136–145)
TOTAL PROTEIN: 6.7 g/dL (ref 6.4–8.3)
Total Bilirubin: 0.3 mg/dL (ref 0.20–1.20)

## 2016-09-16 MED ORDER — PALONOSETRON HCL INJECTION 0.25 MG/5ML
INTRAVENOUS | Status: AC
  Filled 2016-09-16: qty 5

## 2016-09-16 MED ORDER — PALONOSETRON HCL INJECTION 0.25 MG/5ML
0.2500 mg | Freq: Once | INTRAVENOUS | Status: AC
  Administered 2016-09-16: 0.25 mg via INTRAVENOUS

## 2016-09-16 MED ORDER — DOXORUBICIN HCL CHEMO IV INJECTION 2 MG/ML
25.0000 mg/m2 | Freq: Once | INTRAVENOUS | Status: AC
  Administered 2016-09-16: 54 mg via INTRAVENOUS
  Filled 2016-09-16: qty 27

## 2016-09-16 MED ORDER — HEPARIN SOD (PORK) LOCK FLUSH 100 UNIT/ML IV SOLN
500.0000 [IU] | Freq: Once | INTRAVENOUS | Status: AC | PRN
  Administered 2016-09-16: 500 [IU]
  Filled 2016-09-16: qty 5

## 2016-09-16 MED ORDER — SODIUM CHLORIDE 0.9 % IV SOLN
Freq: Once | INTRAVENOUS | Status: AC
  Administered 2016-09-16: 12:00:00 via INTRAVENOUS

## 2016-09-16 MED ORDER — MAGIC MOUTHWASH W/LIDOCAINE: 5.0000 mL | Freq: Four times a day (QID) | ORAL | 0 refills | Status: DC | PRN

## 2016-09-16 MED ORDER — SODIUM CHLORIDE 0.9 % IV SOLN
375.0000 mg/m2 | Freq: Once | INTRAVENOUS | Status: AC
  Administered 2016-09-16: 800 mg via INTRAVENOUS
  Filled 2016-09-16: qty 40

## 2016-09-16 MED ORDER — VINBLASTINE SULFATE CHEMO INJECTION 1 MG/ML
6.0500 mg/m2 | Freq: Once | INTRAVENOUS | Status: AC
  Administered 2016-09-16: 13 mg via INTRAVENOUS
  Filled 2016-09-16: qty 13

## 2016-09-16 MED ORDER — SODIUM CHLORIDE 0.9 % IV SOLN
Freq: Once | INTRAVENOUS | Status: AC
  Administered 2016-09-16: 13:00:00 via INTRAVENOUS
  Filled 2016-09-16: qty 5

## 2016-09-16 MED ORDER — SODIUM CHLORIDE 0.9% FLUSH
10.0000 mL | INTRAVENOUS | Status: DC | PRN
  Administered 2016-09-16: 10 mL
  Filled 2016-09-16: qty 10

## 2016-09-16 MED ORDER — SODIUM CHLORIDE 0.9 % IV SOLN
10.0000 [IU]/m2 | Freq: Once | INTRAVENOUS | Status: AC
  Administered 2016-09-16: 22 [IU] via INTRAVENOUS
  Filled 2016-09-16: qty 7.33

## 2016-09-16 NOTE — Progress Notes (Signed)
Magic mouth wash  rx called into [facility] on [address].

## 2016-09-16 NOTE — Patient Instructions (Signed)
Thank you for choosing [facility] to provide your oncology and hematology care.  To afford each patient quality time with our providers, please arrive 30 minutes before your scheduled appointment time.  If you arrive late for your appointment, you may be asked to reschedule.  We strive to give you quality time with our providers, and arriving late affects you and other patients whose appointments are after yours.   If you are a no show for multiple scheduled visits, you may be dismissed from the clinic at the providers discretion.    Again, thank you for choosing [facility], our hope is that these requests will decrease the amount of time that you wait before being seen by our physicians.  ______________________________________________________________________  Should you have questions after your visit to the [facility], please contact our office at [phone] between the hours of 8:30 and 4:30 p.m.    Voicemails left after 4:30p.m will not be returned until the following business day.    For prescription refill requests, please have your pharmacy contact us directly.  Please also try to allow 48 hours for prescription requests.    Please contact the scheduling department for questions regarding scheduling.  For scheduling of procedures such as PET scans, CT scans, MRI, Ultrasound, etc please contact central scheduling at [phone].    Resources For Cancer Patients and Caregivers:   Oncolink.org:  A wonderful resource for patients and healthcare providers for information regarding your disease, ways to tract your treatment, what to expect, etc.     American Cancer Society:  800-227-2345  Can help patients locate various types of support and financial assistance  Cancer Care: 1-800-813-HOPE (4673) Provides financial assistance, online support groups, medication/co-pay assistance.    [place] DSS:  336-641-3447 Where to apply for food  stamps, Medicaid, and utility assistance  Medicare Rights Center: 800-333-4114 Helps people with Medicare understand their rights and benefits, navigate the Medicare system, and secure the quality healthcare they deserve  SCAT: 336-333-6589 [place] [employer]'s shared-ride transportation service for eligible riders who have a disability that prevents them from riding the fixed route bus.    For additional information on assistance programs please contact our social worker:   Grier Hock/Abigail Elmore:  [phone]            

## 2016-09-16 NOTE — Patient Instructions (Signed)
[facility] Discharge Instructions for Patients Receiving Chemotherapy  Today you received the following chemotherapy agents: Adriamycin, Vinblastine, Bleomycin, and Dacarbazine (DTIC).  To help prevent nausea and vomiting after your treatment, we encourage you to take your nausea medication as instructed.   If you develop nausea and vomiting that is not controlled by your nausea medication, call the clinic.   BELOW ARE SYMPTOMS THAT SHOULD BE REPORTED IMMEDIATELY:  *FEVER GREATER THAN 100.5 F  *CHILLS WITH OR WITHOUT FEVER  NAUSEA AND VOMITING THAT IS NOT CONTROLLED WITH YOUR NAUSEA MEDICATION  *UNUSUAL SHORTNESS OF BREATH  *UNUSUAL BRUISING OR BLEEDING  TENDERNESS IN MOUTH AND THROAT WITH OR WITHOUT PRESENCE OF ULCERS  *URINARY PROBLEMS  *BOWEL PROBLEMS  UNUSUAL RASH Items with * indicate a potential emergency and should be followed up as soon as possible.  Feel free to call the clinic you have any questions or concerns. The clinic phone number is [phone].  Please show the [place] at check-in to the Emergency Department and triage nurse.  Pegfilgrastim injection What is this medicine? PEGFILGRASTIM (PEG fil gra stim) is a long-acting granulocyte colony-stimulating factor that stimulates the growth of neutrophils, a type of white blood cell important in the body's fight against infection. It is used to reduce the incidence of fever and infection in patients with certain types of cancer who are receiving chemotherapy that affects the bone marrow, and to increase survival after being exposed to high doses of radiation. This medicine may be used for other purposes; ask your health care provider or pharmacist if you have questions. COMMON BRAND NAME(S): Neulasta What should I tell my health care provider before I take this medicine? They need to know if you have any of these conditions: -kidney disease -latex allergy -ongoing radiation  therapy -sickle cell disease -skin reactions to acrylic adhesives (On-Body Injector only) -an unusual or allergic reaction to pegfilgrastim, filgrastim, other medicines, foods, dyes, or preservatives -pregnant or trying to get pregnant -breast-feeding How should I use this medicine? This medicine is for injection under the skin. If you get this medicine at home, you will be taught how to prepare and give the pre-filled syringe or how to use the On-body Injector. Refer to the patient Instructions for Use for detailed instructions. Use exactly as directed. Tell your healthcare provider immediately if you suspect that the On-body Injector may not have performed as intended or if you suspect the use of the On-body Injector resulted in a missed or partial dose. It is important that you put your used needles and syringes in a special sharps container. Do not put them in a trash can. If you do not have a sharps container, call your pharmacist or healthcare provider to get one. Talk to your pediatrician regarding the use of this medicine in children. While this drug may be prescribed for selected conditions, precautions do apply. Overdosage: If you think you have taken too much of this medicine contact a poison control center or emergency room at once. NOTE: This medicine is only for you. Do not share this medicine with others. What if I miss a dose? It is important not to miss your dose. Call your doctor or health care professional if you miss your dose. If you miss a dose due to an On-body Injector failure or leakage, a new dose should be administered as soon as possible using a single prefilled syringe for manual use. What may interact with this medicine? Interactions have not been  studied. Give your health care provider a list of all the medicines, herbs, non-prescription drugs, or dietary supplements you use. Also tell them if you smoke, drink alcohol, or use illegal drugs. Some items may interact with  your medicine. This list may not describe all possible interactions. Give your health care provider a list of all the medicines, herbs, non-prescription drugs, or dietary supplements you use. Also tell them if you smoke, drink alcohol, or use illegal drugs. Some items may interact with your medicine. What should I watch for while using this medicine? You may need blood work done while you are taking this medicine. If you are going to need a MRI, CT scan, or other procedure, tell your doctor that you are using this medicine (On-Body Injector only). What side effects may I notice from receiving this medicine? Side effects that you should report to your doctor or health care professional as soon as possible: -allergic reactions like skin rash, itching or hives, swelling of the face, lips, or tongue -dizziness -fever -pain, redness, or irritation at site where injected -pinpoint red spots on the skin -red or dark-brown urine -shortness of breath or breathing problems -stomach or side pain, or pain at the shoulder -swelling -tiredness -trouble passing urine or change in the amount of urine Side effects that usually do not require medical attention (report to your doctor or health care professional if they continue or are bothersome): -bone pain -muscle pain This list may not describe all possible side effects. Call your doctor for medical advice about side effects. You may report side effects to FDA at 1-800-FDA-1088. Where should I keep my medicine? Keep out of the reach of children. Store pre-filled syringes in a refrigerator between 2 and 8 degrees C (36 and 46 degrees F). Do not freeze. Keep in carton to protect from light. Throw away this medicine if it is left out of the refrigerator for more than 48 hours. Throw away any unused medicine after the expiration date. NOTE: This sheet is a summary. It may not cover all possible information. If you have questions about this medicine, talk to  your doctor, pharmacist, or health care provider.  2018 Elsevier/Gold Standard (2016-01-17 12:58:03)

## 2016-09-17 NOTE — Telephone Encounter (Signed)
Left voice message reminding patient of upcoming appointment for 8/16 and to request a new calender with additional added appointment for 9/11

## 2016-09-18 ENCOUNTER — Ambulatory Visit (HOSPITAL_BASED_OUTPATIENT_CLINIC_OR_DEPARTMENT_OTHER): Payer: 59

## 2016-09-18 ENCOUNTER — Ambulatory Visit: Payer: 59

## 2016-09-18 VITALS — BP 131/70 | HR 79 | Temp 97.8°F | Resp 20

## 2016-09-18 DIAGNOSIS — C8148 Lymphocyte-rich classical Hodgkin lymphoma, lymph nodes of multiple sites: Secondary | ICD-10-CM

## 2016-09-18 DIAGNOSIS — Z5189 Encounter for other specified aftercare: Secondary | ICD-10-CM | POA: Diagnosis not present

## 2016-09-18 DIAGNOSIS — C8118 Nodular sclerosis classical Hodgkin lymphoma, lymph nodes of multiple sites: Secondary | ICD-10-CM | POA: Diagnosis not present

## 2016-09-18 MED ORDER — PEGFILGRASTIM INJECTION 6 MG/0.6ML ~~LOC~~
6.0000 mg | PREFILLED_SYRINGE | Freq: Once | SUBCUTANEOUS | Status: AC
  Administered 2016-09-18: 6 mg via SUBCUTANEOUS
  Filled 2016-09-18: qty 0.6

## 2016-09-18 NOTE — Patient Instructions (Signed)
Pegfilgrastim injection What is this medicine? PEGFILGRASTIM (PEG fil gra stim) is a long-acting granulocyte colony-stimulating factor that stimulates the growth of neutrophils, a type of white blood cell important in the body's fight against infection. It is used to reduce the incidence of fever and infection in patients with certain types of cancer who are receiving chemotherapy that affects the bone marrow, and to increase survival after being exposed to high doses of radiation. This medicine may be used for other purposes; ask your health care provider or pharmacist if you have questions. COMMON BRAND NAME(S): Neulasta What should I tell my health care provider before I take this medicine? They need to know if you have any of these conditions: -kidney disease -latex allergy -ongoing radiation therapy -sickle cell disease -skin reactions to acrylic adhesives (On-Body Injector only) -an unusual or allergic reaction to pegfilgrastim, filgrastim, other medicines, foods, dyes, or preservatives -pregnant or trying to get pregnant -breast-feeding How should I use this medicine? This medicine is for injection under the skin. If you get this medicine at home, you will be taught how to prepare and give the pre-filled syringe or how to use the On-body Injector. Refer to the patient Instructions for Use for detailed instructions. Use exactly as directed. Tell your healthcare provider immediately if you suspect that the On-body Injector may not have performed as intended or if you suspect the use of the On-body Injector resulted in a missed or partial dose. It is important that you put your used needles and syringes in a special sharps container. Do not put them in a trash can. If you do not have a sharps container, call your pharmacist or healthcare provider to get one. Talk to your pediatrician regarding the use of this medicine in children. While this drug may be prescribed for selected conditions,  precautions do apply. Overdosage: If you think you have taken too much of this medicine contact a poison control center or emergency room at once. NOTE: This medicine is only for you. Do not share this medicine with others. What if I miss a dose? It is important not to miss your dose. Call your doctor or health care professional if you miss your dose. If you miss a dose due to an On-body Injector failure or leakage, a new dose should be administered as soon as possible using a single prefilled syringe for manual use. What may interact with this medicine? Interactions have not been studied. Give your health care provider a list of all the medicines, herbs, non-prescription drugs, or dietary supplements you use. Also tell them if you smoke, drink alcohol, or use illegal drugs. Some items may interact with your medicine. This list may not describe all possible interactions. Give your health care provider a list of all the medicines, herbs, non-prescription drugs, or dietary supplements you use. Also tell them if you smoke, drink alcohol, or use illegal drugs. Some items may interact with your medicine. What should I watch for while using this medicine? You may need blood work done while you are taking this medicine. If you are going to need a MRI, CT scan, or other procedure, tell your doctor that you are using this medicine (On-Body Injector only). What side effects may I notice from receiving this medicine? Side effects that you should report to your doctor or health care professional as soon as possible: -allergic reactions like skin rash, itching or hives, swelling of the face, lips, or tongue -dizziness -fever -pain, redness, or irritation at site   where injected -pinpoint red spots on the skin -red or dark-brown urine -shortness of breath or breathing problems -stomach or side pain, or pain at the shoulder -swelling -tiredness -trouble passing urine or change in the amount of urine Side  effects that usually do not require medical attention (report to your doctor or health care professional if they continue or are bothersome): -bone pain -muscle pain This list may not describe all possible side effects. Call your doctor for medical advice about side effects. You may report side effects to FDA at 1-800-FDA-1088. Where should I keep my medicine? Keep out of the reach of children. Store pre-filled syringes in a refrigerator between 2 and 8 degrees C (36 and 46 degrees F). Do not freeze. Keep in carton to protect from light. Throw away this medicine if it is left out of the refrigerator for more than 48 hours. Throw away any unused medicine after the expiration date. NOTE: This sheet is a summary. It may not cover all possible information. If you have questions about this medicine, talk to your doctor, pharmacist, or health care provider.  2018 Elsevier/Gold Standard (2016-01-17 12:58:03)  

## 2016-09-21 NOTE — Progress Notes (Signed)
[facility]    HEMATOLOGY/ONCOLOGY CLINIC NOTE  Date of Service: .09/16/2016  Patient Care Team: Donald Prose, MD as PCP - General (Family Medicine)  CHIEF COMPLAINTS/PURPOSE OF CONSULTATION:  F/u for management of Hodgkins lymphoma  HISTORY OF PRESENTING ILLNESS:   Rhonda Mccann is a wonderful 46 y.o. female who has been referred to [place] by Dr .Donald Prose, MD for evaluation and management of newly diagnosed Hodgkins Lymphoma.  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  Patient first reported noticing a lump in the left side of her neck in January 2018. She noted some mild progressive increase in this lump which appeared more prominent and she reported it to her primary care physician. Patient had an ultrasound of her neck on 07/07/2016 which showed a 1.9 x 0.8 x 1.4 cm lymph node with no distinct fatty hilum. This was subsequently biopsied under ultrasound guidance on 07/21/2016 and findings are consistent with classical Hodgkin's lymphoma nodular sclerosis subtype.  Patient notes no other overtly palpable lymphadenopathy noted. No fevers no chills no night sweats no unexpected weight loss. She reports no lethargy no anorexia. Overall feels quite well.  INTERVAL HISTORY  Patient is here for a follow-up prior to cycle 2 day 1 of ABVD. Notes significant bone pains with Neulasta but controlled with Vicodin prn. No nausea. Grade 1 mucositis -controlled with salt/baking soda mouthwashes and prn magic mouthwash. No fever /chills/night sweats or other concerning symptoms at this time.  MEDICAL HISTORY:  Past Medical History:  Diagnosis Date  . Depression   . Drug abuse 2001  EX alcohol abuse sober for 17 years  Donated left kidney in 2013 (single kidney) Scoliosis GERD  SURGICAL HISTORY: Past Surgical History:  Procedure Laterality Date  . IR FLUORO GUIDE PORT  INSERTION RIGHT  08/05/2016  . IR US GUIDE VASC ACCESS RIGHT  08/05/2016  . NEPHRECTOMY Left 2015  . NEPHRECTOMY LIVING DONOR  2013  Breast Augmentation Surgery in 1994 (saline implants)  SOCIAL HISTORY: Social History   Social History  . Marital status: Married    Spouse name: N/A  . Number of children: N/A  . Years of education: N/A   Occupational History  . Not on file.   Social History Main Topics  . Smoking status: Former Smoker    Types: Cigarettes    Quit date: 1998  . Smokeless tobacco: Never Used     Comment: quit 17 yrs ago  . Alcohol use No  . Drug use: No     Comment: Previous drug user - quite 2001  . Sexual activity: Yes   Other Topics Concern  . Not on file   Social History Narrative  . No narrative on file  Works as a Production manager   FAMILY HISTORY: Paternal aunt had breast cancer Paternal grandfather had lymphoma  ALLERGIES:  is allergic to contrast media [iodinated diagnostic agents] and nsaids.  MEDICATIONS:  Current Outpatient Prescriptions  Medication Sig Dispense Refill  . buPROPion (WELLBUTRIN XL) 300 MG 24 hr tablet Take 1 tablet by mouth daily.    [facility]land Kitchen dexamethasone (DECADRON) 4 MG tablet Take 2 tablets (8 mg total) by mouth See admin instructions. 8mg  po with breakfast and lunch for 2 days after each chemotherapy 30 tablet 2  . HYDROcodone-acetaminophen (NORCO) 5-325 MG tablet Take 1-2 tablets by mouth every 6 (six) hours as needed for moderate pain. 30 tablet 0  .  lidocaine-prilocaine (EMLA) cream Apply 1 application topically as needed. 30 g 0  . LORazepam (ATIVAN) 0.5 MG tablet Take 1 tablet (0.5 mg total) by mouth every 8 (eight) hours. 30 tablet 0  . omeprazole (PRILOSEC) 20 MG capsule Take 20 mg by mouth daily.    . ondansetron (ZOFRAN) 8 MG tablet Take 1 tablet (8 mg total) by mouth every 8 (eight) hours as needed for nausea or vomiting. 20 tablet 0  . prochlorperazine (COMPAZINE) 10 MG tablet Take 1 tablet (10 mg total) by mouth every 6  (six) hours as needed for nausea or vomiting. 30 tablet 0  . magic mouthwash w/lidocaine SOLN Take 5 mLs by mouth 4 (four) times daily as needed for mouth pain. 1 part viscous lidocaine 2% 1 part maalox 1 part nystatin 500,000 IU per 86ml 120 mL 0   No current facility-administered medications for this visit.     REVIEW OF SYSTEMS:    10 Point review of Systems was done is negative except as noted above.  PHYSICAL EXAMINATION: ECOG PERFORMANCE STATUS: 0 - Asymptomatic  . Vitals:   09/16/16 1043  BP: 112/64  Pulse: 80  Resp: 18  Temp: 97.9 F (36.6 C)  SpO2: 100%   Filed Weights   09/16/16 1043  Weight: 205 lb 3.2 oz (93.1 kg)   .Body mass index is 30.3 kg/m.  GENERAL:alert, in no acute distress and comfortable SKIN: no acute rashes, no significant lesions EYES: conjunctiva are pink and non-injected, sclera anicteric OROPHARYNX: MMM, no exudates, no oropharyngeal erythema or ulceration NECK: supple, no JVD LYMPH: no palpable lymphadenopathy in the cervical,axillary or inguinal regions. Previous small cervical LN not palpable at this time. LUNGS: clear to auscultation b/l with normal respiratory effort HEART: regular rate & rhythm ABDOMEN:  normoactive bowel sounds , non tender, not distended. Extremity: no pedal edema PSYCH: alert & oriented x 3 with fluent speech NEURO: no focal motor/sensory deficits  LABORATORY DATA:  I have reviewed the data as listed  . CBC Latest Ref Rng & Units 09/16/2016 09/02/2016 08/26/2016  WBC 3.9 - 10.3 10e3/uL 9.4 3.5(L) 2.0(L)  Hemoglobin 11.6 - 15.9 g/dL 12.2 12.6 13.0  Hematocrit 34.8 - 46.6 % 36.8 37.3 39.8  Platelets 145 - 400 10e3/uL 219 304 261   ANC 700 . CMP Latest Ref Rng & Units 09/16/2016 09/02/2016 08/26/2016  Glucose 70 - 140 mg/dl 93 101 97  BUN 7.0 - 26.0 mg/dL 10.5 5.1(L) 9.1  Creatinine 0.6 - 1.1 mg/dL 1.2(H) 1.2(H) 1.1  Sodium 136 - 145 mEq/L 139 140 138  Potassium 3.5 - 5.1 mEq/L 4.0 3.9 4.0  CO2 22 - 29 mEq/L  25 25 24   Calcium 8.4 - 10.4 mg/dL 9.2 9.1 9.4  Total Protein 6.4 - 8.3 g/dL 6.7 6.9 7.6  Total Bilirubin 0.20 - 1.20 mg/dL 0.30 0.22 0.60  Alkaline Phos 40 - 150 U/L 93 85 77  AST 5 - 34 U/L 15 15 19   ALT 0 - 55 U/L 23 23 27          RADIOGRAPHIC STUDIES: I have personally reviewed the radiological images as listed and agreed with the findings in the report. No results found.  ASSESSMENT & PLAN:   46 yo very pleasant caucasian female with   1) Newly diagnosed Stage IIA (adverse risk due to >3 regions of involvement, non bulky) Classical Hodgkins Lymphoma -Nodular Sclerosis Subtype No constitutional symptoms. Sed rate 18 (WNL) PET/CT reviewed ECHO EF 60-65% DLCO uncorrected 70% of predicted. 24h creatinine  clearance WNL.  2) Grade 1 -nausea -resolved -patient has prn zofran and compazine.   3) Grade 1 mucositis - resolved -continue the use of oral cryotherapy during chemotherapy -continue salt/baking soda mouthwashes -given refill on her magic mouthwash for prn use  4) Neutropenia from chemotherapy ANC 700 previously. Now resolved with the use of neulasta. 5) Neulasta related bone pains. Plan -continue Neulasta D3 and D17 every cycle -vicodin prn for bone pains. -daily loratadine for 5-7 days with each cycle to reduce Neulasta-related bone pains.  Plan -Labs reviewed today with the patient. -patient appropriate to proceed with C2D1 of treatment today. -no prohibitive toxicities at this time that require overt treatment changes - PET/CT scan ordered for around 10/07/2016. - If [place] 1-2 will treat with additional 4 cycles of AVD (dropping the bleomycin)  2) Single kidney - patient donated left kidney in 2013 -avoid nephrotoxic medications -Creatinine stable   -Continue chemotherapy as per Orders- plz schedule next 4 cycles -PET/CT ordered- needs to be schedule in 3 weeks (1 week prior to C3D1) -labs with each chemotherapy -RTC with Dr Rhonda Mccann in 4 weeks with  C3D1 of treatment with labs and PET/CT results  All of the patients questions were answered with apparent satisfaction. The patient knows to call the clinic with any problems, questions or concerns.  I spent 20 minutes counseling the patient face to face. The total time spent in the appointment was 25 minutes and more than 50% was on counseling and direct patient cares.    Sullivan Lone MD [place] AAHIVMS [facility] Hematology/Oncology Physician [facility]  (Office):       [phone] (Work cell):  [phone] (Fax):           [phone]

## 2016-09-30 ENCOUNTER — Other Ambulatory Visit (HOSPITAL_BASED_OUTPATIENT_CLINIC_OR_DEPARTMENT_OTHER): Payer: 59

## 2016-09-30 ENCOUNTER — Ambulatory Visit (HOSPITAL_BASED_OUTPATIENT_CLINIC_OR_DEPARTMENT_OTHER): Payer: 59

## 2016-09-30 ENCOUNTER — Ambulatory Visit: Payer: 59

## 2016-09-30 VITALS — BP 115/72 | HR 80 | Temp 98.0°F | Resp 20

## 2016-09-30 DIAGNOSIS — C8110 Nodular sclerosis classical Hodgkin lymphoma, unspecified site: Secondary | ICD-10-CM | POA: Diagnosis not present

## 2016-09-30 DIAGNOSIS — C8118 Nodular sclerosis classical Hodgkin lymphoma, lymph nodes of multiple sites: Secondary | ICD-10-CM

## 2016-09-30 DIAGNOSIS — C8148 Lymphocyte-rich classical Hodgkin lymphoma, lymph nodes of multiple sites: Secondary | ICD-10-CM

## 2016-09-30 DIAGNOSIS — Z95828 Presence of other vascular implants and grafts: Secondary | ICD-10-CM

## 2016-09-30 DIAGNOSIS — Z5111 Encounter for antineoplastic chemotherapy: Secondary | ICD-10-CM

## 2016-09-30 LAB — COMPREHENSIVE METABOLIC PANEL
ALK PHOS: 99 U/L (ref 40–150)
ALT: 21 U/L (ref 0–55)
ANION GAP: 8 meq/L (ref 3–11)
AST: 12 U/L (ref 5–34)
Albumin: 3.5 g/dL (ref 3.5–5.0)
BUN: 9.4 mg/dL (ref 7.0–26.0)
CALCIUM: 9.8 mg/dL (ref 8.4–10.4)
CHLORIDE: 105 meq/L (ref 98–109)
CO2: 25 mEq/L (ref 22–29)
Creatinine: 1.1 mg/dL (ref 0.6–1.1)
EGFR: 64 mL/min/{1.73_m2} — ABNORMAL LOW (ref >90)
Glucose: 117 mg/dl (ref 70–140)
POTASSIUM: 3.6 meq/L (ref 3.5–5.1)
Sodium: 139 mEq/L (ref 136–145)
Total Bilirubin: 0.25 mg/dL (ref 0.20–1.20)
Total Protein: 6.8 g/dL (ref 6.4–8.3)

## 2016-09-30 LAB — CBC & DIFF AND RETIC
BASO%: 0.3 % (ref 0.0–2.0)
BASOS ABS: 0 10*3/uL (ref 0.0–0.1)
EOS%: 0.9 % (ref 0.0–7.0)
Eosinophils Absolute: 0.1 10*3/uL (ref 0.0–0.5)
HEMATOCRIT: 36.7 % (ref 34.8–46.6)
HGB: 12 g/dL (ref 11.6–15.9)
Immature Retic Fract: 24 % — ABNORMAL HIGH (ref 1.60–10.00)
LYMPH%: 25.3 % (ref 14.0–49.7)
MCH: 31.4 pg (ref 25.1–34.0)
MCHC: 32.7 g/dL (ref 31.5–36.0)
MCV: 96.1 fL (ref 79.5–101.0)
MONO#: 0.4 10*3/uL (ref 0.1–0.9)
MONO%: 5 % (ref 0.0–14.0)
NEUT#: 5.4 10*3/uL (ref 1.5–6.5)
NEUT%: 68.5 % (ref 38.4–76.8)
PLATELETS: 247 10*3/uL (ref 145–400)
RBC: 3.82 10*6/uL (ref 3.70–5.45)
RDW: 13.9 % (ref 11.2–14.5)
RETIC %: 5.27 % — AB (ref 0.70–2.10)
Retic Ct Abs: 201.31 10*3/uL — ABNORMAL HIGH (ref 33.70–90.70)
WBC: 7.8 10*3/uL (ref 3.9–10.3)
lymph#: 2 10*3/uL (ref 0.9–3.3)

## 2016-09-30 MED ORDER — PALONOSETRON HCL INJECTION 0.25 MG/5ML
0.2500 mg | Freq: Once | INTRAVENOUS | Status: AC
  Administered 2016-09-30: 0.25 mg via INTRAVENOUS

## 2016-09-30 MED ORDER — SODIUM CHLORIDE 0.9% FLUSH
10.0000 mL | INTRAVENOUS | Status: DC | PRN
  Administered 2016-09-30: 10 mL
  Filled 2016-09-30: qty 10

## 2016-09-30 MED ORDER — HEPARIN SOD (PORK) LOCK FLUSH 100 UNIT/ML IV SOLN
500.0000 [IU] | Freq: Once | INTRAVENOUS | Status: AC | PRN
  Administered 2016-09-30: 500 [IU]
  Filled 2016-09-30: qty 5

## 2016-09-30 MED ORDER — VINBLASTINE SULFATE CHEMO INJECTION 1 MG/ML
6.0500 mg/m2 | Freq: Once | INTRAVENOUS | Status: AC
  Administered 2016-09-30: 13 mg via INTRAVENOUS
  Filled 2016-09-30: qty 13

## 2016-09-30 MED ORDER — SODIUM CHLORIDE 0.9 % IV SOLN
10.0000 [IU]/m2 | Freq: Once | INTRAVENOUS | Status: AC
  Administered 2016-09-30: 22 [IU] via INTRAVENOUS
  Filled 2016-09-30: qty 7.33

## 2016-09-30 MED ORDER — FOSAPREPITANT DIMEGLUMINE INJECTION 150 MG
Freq: Once | INTRAVENOUS | Status: AC
  Administered 2016-09-30: 10:00:00 via INTRAVENOUS
  Filled 2016-09-30: qty 5

## 2016-09-30 MED ORDER — SODIUM CHLORIDE 0.9 % IV SOLN
Freq: Once | INTRAVENOUS | Status: AC
  Administered 2016-09-30: 10:00:00 via INTRAVENOUS

## 2016-09-30 MED ORDER — DOXORUBICIN HCL CHEMO IV INJECTION 2 MG/ML
25.0000 mg/m2 | Freq: Once | INTRAVENOUS | Status: AC
  Administered 2016-09-30: 54 mg via INTRAVENOUS
  Filled 2016-09-30: qty 27

## 2016-09-30 MED ORDER — PALONOSETRON HCL INJECTION 0.25 MG/5ML
INTRAVENOUS | Status: AC
  Filled 2016-09-30: qty 5

## 2016-09-30 MED ORDER — SODIUM CHLORIDE 0.9 % IV SOLN
375.0000 mg/m2 | Freq: Once | INTRAVENOUS | Status: AC
  Administered 2016-09-30: 800 mg via INTRAVENOUS
  Filled 2016-09-30: qty 40

## 2016-09-30 MED ORDER — SODIUM CHLORIDE 0.9% FLUSH
10.0000 mL | Freq: Once | INTRAVENOUS | Status: AC
  Administered 2016-09-30: 10 mL
  Filled 2016-09-30: qty 10

## 2016-09-30 NOTE — Progress Notes (Signed)
Blood return noted before, every 53mls and after Adrimaycin push. Blood return also noted before and after Velban infusion.

## 2016-09-30 NOTE — Patient Instructions (Signed)
[facility] Discharge Instructions for Patients Receiving Chemotherapy  Today you received the following chemotherapy agents Adriamycin,Velban,Bleocin and DTIC  To help prevent nausea and vomiting after your treatment, we encourage you to take your nausea medication as directed   If you develop nausea and vomiting that is not controlled by your nausea medication, call the clinic.   BELOW ARE SYMPTOMS THAT SHOULD BE REPORTED IMMEDIATELY:  *FEVER GREATER THAN 100.5 F  *CHILLS WITH OR WITHOUT FEVER  NAUSEA AND VOMITING THAT IS NOT CONTROLLED WITH YOUR NAUSEA MEDICATION  *UNUSUAL SHORTNESS OF BREATH  *UNUSUAL BRUISING OR BLEEDING  TENDERNESS IN MOUTH AND THROAT WITH OR WITHOUT PRESENCE OF ULCERS  *URINARY PROBLEMS  *BOWEL PROBLEMS  UNUSUAL RASH Items with * indicate a potential emergency and should be followed up as soon as possible.  Feel free to call the clinic you have any questions or concerns. The clinic phone number is [phone].  Please show the [place] at check-in to the Emergency Department and triage nurse.

## 2016-10-01 ENCOUNTER — Other Ambulatory Visit: Payer: Self-pay

## 2016-10-01 ENCOUNTER — Other Ambulatory Visit: Payer: Self-pay | Admitting: Hematology

## 2016-10-01 LAB — SEDIMENTATION RATE: Sedimentation Rate-Westergren: 12 mm/hr (ref 0–32)

## 2016-10-01 MED ORDER — PROCHLORPERAZINE MALEATE 10 MG PO TABS: 10.0000 mg | ORAL_TABLET | Freq: Four times a day (QID) | ORAL | 0 refills | Status: DC | PRN

## 2016-10-02 ENCOUNTER — Ambulatory Visit (HOSPITAL_BASED_OUTPATIENT_CLINIC_OR_DEPARTMENT_OTHER): Payer: 59

## 2016-10-02 ENCOUNTER — Ambulatory Visit: Payer: 59

## 2016-10-02 ENCOUNTER — Other Ambulatory Visit: Payer: Self-pay | Admitting: *Deleted

## 2016-10-02 VITALS — HR 79 | Temp 97.9°F | Resp 18

## 2016-10-02 DIAGNOSIS — Z5189 Encounter for other specified aftercare: Secondary | ICD-10-CM

## 2016-10-02 DIAGNOSIS — C8148 Lymphocyte-rich classical Hodgkin lymphoma, lymph nodes of multiple sites: Secondary | ICD-10-CM

## 2016-10-02 DIAGNOSIS — C8118 Nodular sclerosis classical Hodgkin lymphoma, lymph nodes of multiple sites: Secondary | ICD-10-CM

## 2016-10-02 MED ORDER — LORAZEPAM 0.5 MG PO TABS: 0.5000 mg | ORAL_TABLET | Freq: Three times a day (TID) | ORAL | 0 refills | Status: DC

## 2016-10-02 MED ORDER — PEGFILGRASTIM INJECTION 6 MG/0.6ML ~~LOC~~
6.0000 mg | PREFILLED_SYRINGE | Freq: Once | SUBCUTANEOUS | Status: AC
  Administered 2016-10-02: 6 mg via SUBCUTANEOUS
  Filled 2016-10-02: qty 0.6

## 2016-10-02 NOTE — Patient Instructions (Signed)
Pegfilgrastim injection What is this medicine? PEGFILGRASTIM (PEG fil gra stim) is a long-acting granulocyte colony-stimulating factor that stimulates the growth of neutrophils, a type of white blood cell important in the body's fight against infection. It is used to reduce the incidence of fever and infection in patients with certain types of cancer who are receiving chemotherapy that affects the bone marrow, and to increase survival after being exposed to high doses of radiation. This medicine may be used for other purposes; ask your health care provider or pharmacist if you have questions. COMMON BRAND NAME(S): Neulasta What should I tell my health care provider before I take this medicine? They need to know if you have any of these conditions: -kidney disease -latex allergy -ongoing radiation therapy -sickle cell disease -skin reactions to acrylic adhesives (On-Body Injector only) -an unusual or allergic reaction to pegfilgrastim, filgrastim, other medicines, foods, dyes, or preservatives -pregnant or trying to get pregnant -breast-feeding How should I use this medicine? This medicine is for injection under the skin. If you get this medicine at home, you will be taught how to prepare and give the pre-filled syringe or how to use the On-body Injector. Refer to the patient Instructions for Use for detailed instructions. Use exactly as directed. Tell your healthcare provider immediately if you suspect that the On-body Injector may not have performed as intended or if you suspect the use of the On-body Injector resulted in a missed or partial dose. It is important that you put your used needles and syringes in a special sharps container. Do not put them in a trash can. If you do not have a sharps container, call your pharmacist or healthcare provider to get one. Talk to your pediatrician regarding the use of this medicine in children. While this drug may be prescribed for selected conditions,  precautions do apply. Overdosage: If you think you have taken too much of this medicine contact a poison control center or emergency room at once. NOTE: This medicine is only for you. Do not share this medicine with others. What if I miss a dose? It is important not to miss your dose. Call your doctor or health care professional if you miss your dose. If you miss a dose due to an On-body Injector failure or leakage, a new dose should be administered as soon as possible using a single prefilled syringe for manual use. What may interact with this medicine? Interactions have not been studied. Give your health care provider a list of all the medicines, herbs, non-prescription drugs, or dietary supplements you use. Also tell them if you smoke, drink alcohol, or use illegal drugs. Some items may interact with your medicine. This list may not describe all possible interactions. Give your health care provider a list of all the medicines, herbs, non-prescription drugs, or dietary supplements you use. Also tell them if you smoke, drink alcohol, or use illegal drugs. Some items may interact with your medicine. What should I watch for while using this medicine? You may need blood work done while you are taking this medicine. If you are going to need a MRI, CT scan, or other procedure, tell your doctor that you are using this medicine (On-Body Injector only). What side effects may I notice from receiving this medicine? Side effects that you should report to your doctor or health care professional as soon as possible: -allergic reactions like skin rash, itching or hives, swelling of the face, lips, or tongue -dizziness -fever -pain, redness, or irritation at site   where injected -pinpoint red spots on the skin -red or dark-brown urine -shortness of breath or breathing problems -stomach or side pain, or pain at the shoulder -swelling -tiredness -trouble passing urine or change in the amount of urine Side  effects that usually do not require medical attention (report to your doctor or health care professional if they continue or are bothersome): -bone pain -muscle pain This list may not describe all possible side effects. Call your doctor for medical advice about side effects. You may report side effects to FDA at 1-800-FDA-1088. Where should I keep my medicine? Keep out of the reach of children. Store pre-filled syringes in a refrigerator between 2 and 8 degrees C (36 and 46 degrees F). Do not freeze. Keep in carton to protect from light. Throw away this medicine if it is left out of the refrigerator for more than 48 hours. Throw away any unused medicine after the expiration date. NOTE: This sheet is a summary. It may not cover all possible information. If you have questions about this medicine, talk to your doctor, pharmacist, or health care provider.  2018 Elsevier/Gold Standard (2016-01-17 12:58:03)  

## 2016-10-07 ENCOUNTER — Ambulatory Visit (HOSPITAL_COMMUNITY)
Admission: RE | Admit: 2016-10-07 | Discharge: 2016-10-07 | Disposition: A | Discharge status: Home / Self Care | Payer: 59 | Source: Ambulatory Visit | Attending: Hematology | Admitting: Hematology

## 2016-10-07 DIAGNOSIS — C8110 Nodular sclerosis classical Hodgkin lymphoma, unspecified site: Secondary | ICD-10-CM | POA: Insufficient documentation

## 2016-10-07 DIAGNOSIS — C8190 Hodgkin lymphoma, unspecified, unspecified site: Secondary | ICD-10-CM | POA: Diagnosis not present

## 2016-10-07 LAB — GLUCOSE, CAPILLARY: Glucose-Capillary: 107 mg/dL — ABNORMAL HIGH (ref 65–99)

## 2016-10-07 MED ORDER — FLUDEOXYGLUCOSE F - 18 (FDG) INJECTION
10.1100 | Freq: Once | INTRAVENOUS | Status: AC | PRN
  Administered 2016-10-07: 10.11 via INTRAVENOUS

## 2016-10-13 NOTE — Progress Notes (Signed)
Marland Kitchen    HEMATOLOGY/ONCOLOGY CLINIC NOTE  Date of Service: 10/14/2016   Patient Care Team: Donald Prose, MD as PCP - General (Family Medicine)  CHIEF COMPLAINTS/PURPOSE OF CONSULTATION:  F/u for management of Hodgkins lymphoma  HISTORY OF PRESENTING ILLNESS:   Rhonda Mccann is a wonderful 46 y.o. female who has been referred to [place] by Dr .Donald Prose, MD for evaluation and management of newly diagnosed Hodgkins Lymphoma.  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  Patient first reported noticing a lump in the left side of her neck in January 2018. She noted some mild progressive increase in this lump which appeared more prominent and she reported it to her primary care physician. Patient had an ultrasound of her neck on 07/07/2016 which showed a 1.9 x 0.8 x 1.4 cm lymph node with no distinct fatty hilum. This was subsequently biopsied under ultrasound guidance on 07/21/2016 and findings are consistent with classical Hodgkin's lymphoma nodular sclerosis subtype.  Patient notes no other overtly palpable lymphadenopathy noted. No fevers no chills no night sweats no unexpected weight loss. She reports no lethargy no anorexia. Overall feels quite well.  INTERVAL HISTORY  Patient is here for a follow-up and cycle 3 day 1 of AVD accompanied by family. She has been feeling slightly more fatigued compared to when she first begun treatment. She was offered option to get a referral to outpatient cancer rehab for PT but wants to think about this.. She reports to having trouble sleeping but does not want sleeping supplements at this time.  Her mucositis has resolved now.  No fever /chills/night sweats, skin rashes or other concerning symptoms at this time. We discussed her PET/CT scan in details which shows excellent response to treatment. No other prohibitive new  toxicities.  MEDICAL HISTORY:  Past Medical History:  Diagnosis Date  . Depression   . Drug abuse 2001  EX alcohol abuse sober for 17 years  Donated left kidney in 2013 (single kidney) Scoliosis GERD  SURGICAL HISTORY: Past Surgical History:  Procedure Laterality Date  . IR FLUORO GUIDE PORT INSERTION RIGHT  08/05/2016  . IR US GUIDE VASC ACCESS RIGHT  08/05/2016  . NEPHRECTOMY Left 2015  . NEPHRECTOMY LIVING DONOR  2013  Breast Augmentation Surgery in 1994 (saline implants)  SOCIAL HISTORY: Social History   Social History  . Marital status: Married    Spouse name: N/A  . Number of children: N/A  . Years of education: N/A   Occupational History  . Not on file.   Social History Main Topics  . Smoking status: Former Smoker    Types: Cigarettes    Quit date: 1998  . Smokeless tobacco: Never Used     Comment: quit 17 yrs ago  . Alcohol use No  . Drug use: No     Comment: Previous drug user - quite 2001  . Sexual activity: Yes   Other Topics Concern  . Not on file   Social History Narrative  . No narrative on file  Works as a Production manager   FAMILY HISTORY: Paternal aunt had breast cancer Paternal grandfather had lymphoma  ALLERGIES:  is allergic to contrast media [iodinated diagnostic agents] and nsaids.  MEDICATIONS:  Current Outpatient Prescriptions  Medication Sig Dispense Refill  . buPROPion (WELLBUTRIN XL) 300 MG 24 hr tablet Take 1 tablet by mouth daily.    Marland Kitchen dexamethasone (  DECADRON) 4 MG tablet Take 2 tablets (8 mg total) by mouth See admin instructions. 8mg  po with breakfast and lunch for 2 days after each chemotherapy 30 tablet 2  . HYDROcodone-acetaminophen (NORCO) 5-325 MG tablet Take 1-2 tablets by mouth every 6 (six) hours as needed for moderate pain. 30 tablet 0  . lidocaine-prilocaine (EMLA) cream Apply 1 application topically as needed. 30 g 0  . LORazepam (ATIVAN) 0.5 MG tablet Take 1 tablet (0.5 mg total) by mouth every 8 (eight) hours. 30  tablet 0  . magic mouthwash w/lidocaine SOLN Take 5 mLs by mouth 4 (four) times daily as needed for mouth pain. 1 part viscous lidocaine 2% 1 part maalox 1 part nystatin 500,000 IU per 27ml 120 mL 0  . omeprazole (PRILOSEC) 20 MG capsule Take 20 mg by mouth daily.    . ondansetron (ZOFRAN) 8 MG tablet Take 1 tablet (8 mg total) by mouth every 8 (eight) hours as needed for nausea or vomiting. 20 tablet 0  . prochlorperazine (COMPAZINE) 10 MG tablet Take 1 tablet (10 mg total) by mouth every 6 (six) hours as needed for nausea or vomiting. 30 tablet 0  . traZODone (DESYREL) 50 MG tablet Take 1 tablet (50 mg total) by mouth at bedtime as needed for sleep. 30 tablet 1   No current facility-administered medications for this visit.     REVIEW OF SYSTEMS:    10 Point review of Systems was done is negative except as noted above.  PHYSICAL EXAMINATION: ECOG PERFORMANCE STATUS: 0 - Asymptomatic  . Vitals:   10/14/16 1027  BP: 124/68  Pulse: 88  Resp: 20  Temp: 98.4 F (36.9 C)  SpO2: 100%   Filed Weights   10/14/16 1027  Weight: 204 lb 14.4 oz (92.9 kg)   .Body mass index is 30.26 kg/m.  GENERAL:alert, in no acute distress and comfortable SKIN: no acute rashes, no significant lesions EYES: conjunctiva are pink and non-injected, sclera anicteric OROPHARYNX: MMM, no exudates, no oropharyngeal erythema or ulceration NECK: supple, no JVD LYMPH: no palpable lymphadenopathy in the cervical,axillary or inguinal regions. Previous small cervical LN not palpable at this time. LUNGS: clear to auscultation b/l with normal respiratory effort HEART: regular rate & rhythm ABDOMEN:  normoactive bowel sounds , non tender, not distended. Extremity: no pedal edema PSYCH: alert & oriented x 3 with fluent speech NEURO: no focal motor/sensory deficits  LABORATORY DATA:  I have reviewed the data as listed  . CBC Latest Ref Rng & Units 10/14/2016 09/30/2016 09/16/2016  WBC 3.9 - 10.3 10e3/uL 11.7(H)  7.8 9.4  Hemoglobin 11.6 - 15.9 g/dL 11.4(L) 12.0 12.2  Hematocrit 34.8 - 46.6 % 34.0(L) 36.7 36.8  Platelets 145 - 400 10e3/uL 225 247 219   ANC 700 . CMP Latest Ref Rng & Units 10/14/2016 09/30/2016 09/16/2016  Glucose 70 - 140 mg/dl 108 117 93  BUN 7.0 - 26.0 mg/dL 5.5(L) 9.4 10.5  Creatinine 0.6 - 1.1 mg/dL 1.1 1.1 1.2(H)  Sodium 136 - 145 mEq/L 140 139 139  Potassium 3.5 - 5.1 mEq/L 3.7 3.6 4.0  CO2 22 - 29 mEq/L 25 25 25   Calcium 8.4 - 10.4 mg/dL 9.0 9.8 9.2  Total Protein 6.4 - 8.3 g/dL 6.6 6.8 6.7  Total Bilirubin 0.20 - 1.20 mg/dL 0.31 0.25 0.30  Alkaline Phos 40 - 150 U/L 85 99 93  AST 5 - 34 U/L 14 12 15   ALT 0 - 55 U/L 18 21 23  RADIOGRAPHIC STUDIES: I have personally reviewed the radiological images as listed and agreed with the findings in the report. Nm Pet Image Restag (ps) Skull Base To Thigh  Result Date: 10/07/2016 CLINICAL DATA:  Subsequent treatment strategy for Hodgkin lymphoma. Status post 2 cycles of chemotherapy. EXAM: NUCLEAR MEDICINE PET SKULL BASE TO THIGH TECHNIQUE: 10.1 mCi F-18 FDG was injected intravenously. Full-ring PET imaging was performed from the skull base to thigh after the radiotracer. CT data was obtained and used for attenuation correction and anatomic localization. FASTING BLOOD GLUCOSE:  Value: 107 mg/dl COMPARISON:  08/18/2016 FINDINGS: NECK: A left level 5 lymph node currently measures 7 mm in short axis on image 35/4 compared to 11 mm previously. This has minimal residual FDG uptake, with SUV max currently measuring 1.9 compared to 12.4 previously. FDG uptake in the palatine tonsils remain symmetric a mildly decreased since previous study, with SUV max currently measuring 4.2 compared to 6.8 previously. CHEST: Previously seen thoracic hypermetabolic lymphadenopathy is with resolved since previous study. No residual hypermetabolic lymph nodes or masses identified. No suspicious pulmonary nodules identified on CT images. Mild scarring in  subpleural nodular density in the right middle lobe remain stable. ABDOMEN/PELVIS: No abnormal hypermetabolic activity within the liver, pancreas, adrenal glands, or spleen. Prior left nephrectomy again noted. No hypermetabolic lymph nodes in the abdomen or pelvis. SKELETON: Diffusely increased marrow FDG uptake is seen, consistent with metabolic response to therapy. IMPRESSION: Near complete metabolic response to therapy, with a single residual sub-cm left level 5 cervical lymph node with low-grade FDG uptake (Deauville score 2). Electronically Signed   By: Earle Gell M.D.   On: 10/07/2016 08:50    ASSESSMENT & PLAN:   46 yo very pleasant caucasian female with   1) Stage IIA (adverse risk due to >3 regions of involvement, non bulky) Classical Hodgkins Lymphoma -Nodular Sclerosis Subtype No constitutional symptoms. Sed rate 18 (WNL) PET/CT reviewed ECHO EF 60-65% DLCO uncorrected 70% of predicted. 24h creatinine clearance WNL.  Patient is s/p 2 cycles of ABVD  -PET from 10/07/2016 reviewed. Near complete metabolic response to therapy and no residualk disease > Deauville 2  2) Grade 1 -nausea -resolved -patient has prn zofran and compazine.   3) Grade 1 mucositis - resolved -continue the use of oral cryotherapy during chemotherapy -continue salt/baking soda mouthwashes -given refill on her magic mouthwash for prn use  4) Neutropenia from chemotherapy ANC 700 previously. Now resolved with the use of neulasta. 5) Neulasta related bone pains -controlled with prn pain medications. Plan -continue Neulasta D3 and D17 every cycle -vicodin prn for bone pains. -daily loratadine for 5-7 days with each cycle to reduce Neulasta-related bone pains.  6) Insomnia - she has trouble sleeping some nights trazodone prn   Plan -Labs and PET from 9/4 reviewed today with the patient and she and her family are understandably glad with the response to treatment thus far. -patient appropriate to proceed  with C3D1 of treatment (AVD)  today, no bleomycin for the remaining 4 cycles -no prohibitive toxicities at this time that require overt treatment changes -continue neulasta support  2) Single kidney - patient donated left kidney in 2013 -avoid nephrotoxic medications -Creatinine stable  -continue AVD q2weeks (dropping Bleomycin)  -plz schedule next 2 cycles of treatment (4 doses) q2weeks. -labs q2weeks -RTC with Dr Irene Limbo in 4 weeks with C4D1 of treatment with labs  - PET scan before end of year after completion of 6 cycles of treatment.  All of the patients  questions were answered with apparent satisfaction. The patient knows to call the clinic with any problems, questions or concerns.  This document serves as a record of services personally performed by Sullivan Lone, MD. It was created on her behalf by Brandt Loosen, a trained medical scribe. The creation of this record is based on the scribe's personal observations and the provider's statements to them. This document has been checked and approved by the attending provider.  I spent 20 minutes counseling the patient face to face. The total time spent in the appointment was 25 minutes and more than 50% was on counseling and direct patient cares.    Sullivan Lone MD [place] AAHIVMS [facility] Hematology/Oncology Physician [facility]  (Office):       [phone] (Work cell):  [phone] (Fax):           [phone]

## 2016-10-14 ENCOUNTER — Ambulatory Visit (HOSPITAL_BASED_OUTPATIENT_CLINIC_OR_DEPARTMENT_OTHER): Payer: 59

## 2016-10-14 ENCOUNTER — Ambulatory Visit: Payer: 59

## 2016-10-14 ENCOUNTER — Encounter: Payer: Self-pay | Admitting: Hematology

## 2016-10-14 ENCOUNTER — Ambulatory Visit (HOSPITAL_BASED_OUTPATIENT_CLINIC_OR_DEPARTMENT_OTHER): Payer: 59 | Admitting: Hematology

## 2016-10-14 ENCOUNTER — Other Ambulatory Visit: Payer: Self-pay

## 2016-10-14 VITALS — BP 124/68 | HR 88 | Temp 98.4°F | Resp 20 | Ht 69.0 in | Wt 204.9 lb

## 2016-10-14 DIAGNOSIS — C8118 Nodular sclerosis classical Hodgkin lymphoma, lymph nodes of multiple sites: Secondary | ICD-10-CM | POA: Diagnosis not present

## 2016-10-14 DIAGNOSIS — G4700 Insomnia, unspecified: Secondary | ICD-10-CM | POA: Diagnosis not present

## 2016-10-14 DIAGNOSIS — C8148 Lymphocyte-rich classical Hodgkin lymphoma, lymph nodes of multiple sites: Secondary | ICD-10-CM | POA: Diagnosis not present

## 2016-10-14 DIAGNOSIS — G4701 Insomnia due to medical condition: Secondary | ICD-10-CM

## 2016-10-14 DIAGNOSIS — Z5111 Encounter for antineoplastic chemotherapy: Secondary | ICD-10-CM

## 2016-10-14 DIAGNOSIS — Z95828 Presence of other vascular implants and grafts: Secondary | ICD-10-CM

## 2016-10-14 LAB — CBC WITH DIFFERENTIAL/PLATELET
BASO%: 0.6 % (ref 0.0–2.0)
BASOS ABS: 0.1 10*3/uL (ref 0.0–0.1)
EOS%: 0.4 % (ref 0.0–7.0)
Eosinophils Absolute: 0.1 10*3/uL (ref 0.0–0.5)
HCT: 34 % — ABNORMAL LOW (ref 34.8–46.6)
HGB: 11.4 g/dL — ABNORMAL LOW (ref 11.6–15.9)
LYMPH#: 1.3 10*3/uL (ref 0.9–3.3)
LYMPH%: 11.4 % — ABNORMAL LOW (ref 14.0–49.7)
MCH: 31.7 pg (ref 25.1–34.0)
MCHC: 33.6 g/dL (ref 31.5–36.0)
MCV: 94.3 fL (ref 79.5–101.0)
MONO#: 0.6 10*3/uL (ref 0.1–0.9)
MONO%: 4.9 % (ref 0.0–14.0)
NEUT#: 9.7 10*3/uL — ABNORMAL HIGH (ref 1.5–6.5)
NEUT%: 82.7 % — ABNORMAL HIGH (ref 38.4–76.8)
Platelets: 225 10*3/uL (ref 145–400)
RBC: 3.6 10*6/uL — AB (ref 3.70–5.45)
RDW: 15.1 % — ABNORMAL HIGH (ref 11.2–14.5)
WBC: 11.7 10*3/uL — ABNORMAL HIGH (ref 3.9–10.3)

## 2016-10-14 LAB — COMPREHENSIVE METABOLIC PANEL
ALT: 18 U/L (ref 0–55)
AST: 14 U/L (ref 5–34)
Albumin: 3.6 g/dL (ref 3.5–5.0)
Alkaline Phosphatase: 85 U/L (ref 40–150)
Anion Gap: 7 mEq/L (ref 3–11)
BUN: 5.5 mg/dL — ABNORMAL LOW (ref 7.0–26.0)
CHLORIDE: 107 meq/L (ref 98–109)
CO2: 25 meq/L (ref 22–29)
CREATININE: 1.1 mg/dL (ref 0.6–1.1)
Calcium: 9 mg/dL (ref 8.4–10.4)
EGFR: 64 mL/min/{1.73_m2} — ABNORMAL LOW (ref >90)
Glucose: 108 mg/dl (ref 70–140)
Potassium: 3.7 mEq/L (ref 3.5–5.1)
Sodium: 140 mEq/L (ref 136–145)
Total Bilirubin: 0.31 mg/dL (ref 0.20–1.20)
Total Protein: 6.6 g/dL (ref 6.4–8.3)

## 2016-10-14 MED ORDER — DOXORUBICIN HCL CHEMO IV INJECTION 2 MG/ML
25.0000 mg/m2 | Freq: Once | INTRAVENOUS | Status: AC
  Administered 2016-10-14: 54 mg via INTRAVENOUS
  Filled 2016-10-14: qty 27

## 2016-10-14 MED ORDER — SODIUM CHLORIDE 0.9% FLUSH
10.0000 mL | Freq: Once | INTRAVENOUS | Status: AC
  Administered 2016-10-14: 10 mL
  Filled 2016-10-14: qty 10

## 2016-10-14 MED ORDER — TRAZODONE HCL 50 MG PO TABS: 50.0000 mg | ORAL_TABLET | Freq: Every evening | ORAL | 1 refills | Status: DC | PRN

## 2016-10-14 MED ORDER — PALONOSETRON HCL INJECTION 0.25 MG/5ML
0.2500 mg | Freq: Once | INTRAVENOUS | Status: AC
  Administered 2016-10-14: 0.25 mg via INTRAVENOUS

## 2016-10-14 MED ORDER — PALONOSETRON HCL INJECTION 0.25 MG/5ML
INTRAVENOUS | Status: AC
  Filled 2016-10-14: qty 5

## 2016-10-14 MED ORDER — SODIUM CHLORIDE 0.9% FLUSH
10.0000 mL | INTRAVENOUS | Status: DC | PRN
  Administered 2016-10-14: 10 mL
  Filled 2016-10-14: qty 10

## 2016-10-14 MED ORDER — VINBLASTINE SULFATE CHEMO INJECTION 1 MG/ML
6.0500 mg/m2 | Freq: Once | INTRAVENOUS | Status: AC
  Administered 2016-10-14: 13 mg via INTRAVENOUS
  Filled 2016-10-14: qty 13

## 2016-10-14 MED ORDER — SODIUM CHLORIDE 0.9 % IV SOLN
375.0000 mg/m2 | Freq: Once | INTRAVENOUS | Status: AC
  Administered 2016-10-14: 800 mg via INTRAVENOUS
  Filled 2016-10-14: qty 40

## 2016-10-14 MED ORDER — HEPARIN SOD (PORK) LOCK FLUSH 100 UNIT/ML IV SOLN
500.0000 [IU] | Freq: Once | INTRAVENOUS | Status: AC | PRN
  Administered 2016-10-14: 500 [IU]
  Filled 2016-10-14: qty 5

## 2016-10-14 MED ORDER — SODIUM CHLORIDE 0.9 % IV SOLN
Freq: Once | INTRAVENOUS | Status: AC
  Administered 2016-10-14: 12:00:00 via INTRAVENOUS
  Filled 2016-10-14: qty 5

## 2016-10-14 MED ORDER — SODIUM CHLORIDE 0.9 % IV SOLN
Freq: Once | INTRAVENOUS | Status: AC
  Administered 2016-10-14: 12:00:00 via INTRAVENOUS

## 2016-10-14 NOTE — Patient Instructions (Signed)
[facility] Discharge Instructions for Patients Receiving Chemotherapy  Today you received the following chemotherapy agents Adriamycin,Vinblastine, Bleomycin and DTIC  To help prevent nausea and vomiting after your treatment, we encourage you to take your nausea medication as directed  If you develop nausea and vomiting that is not controlled by your nausea medication, call the clinic.   BELOW ARE SYMPTOMS THAT SHOULD BE REPORTED IMMEDIATELY:  *FEVER GREATER THAN 100.5 F  *CHILLS WITH OR WITHOUT FEVER  NAUSEA AND VOMITING THAT IS NOT CONTROLLED WITH YOUR NAUSEA MEDICATION  *UNUSUAL SHORTNESS OF BREATH  *UNUSUAL BRUISING OR BLEEDING  TENDERNESS IN MOUTH AND THROAT WITH OR WITHOUT PRESENCE OF ULCERS  *URINARY PROBLEMS  *BOWEL PROBLEMS  UNUSUAL RASH Items with * indicate a potential emergency and should be followed up as soon as possible.  Feel free to call the clinic you have any questions or concerns. The clinic phone number is [phone].  Please show the [place] at check-in to the Emergency Department and triage nurse.

## 2016-10-15 LAB — SEDIMENTATION RATE: Sedimentation Rate-Westergren: 8 mm/hr (ref 0–32)

## 2016-10-16 ENCOUNTER — Ambulatory Visit: Payer: 59

## 2016-10-17 ENCOUNTER — Telehealth: Payer: Self-pay | Admitting: Hematology

## 2016-10-17 ENCOUNTER — Ambulatory Visit (HOSPITAL_BASED_OUTPATIENT_CLINIC_OR_DEPARTMENT_OTHER): Payer: 59

## 2016-10-17 VITALS — BP 123/74 | HR 92 | Temp 97.8°F | Resp 18

## 2016-10-17 DIAGNOSIS — Z5189 Encounter for other specified aftercare: Secondary | ICD-10-CM | POA: Diagnosis not present

## 2016-10-17 DIAGNOSIS — C8148 Lymphocyte-rich classical Hodgkin lymphoma, lymph nodes of multiple sites: Secondary | ICD-10-CM

## 2016-10-17 DIAGNOSIS — C8118 Nodular sclerosis classical Hodgkin lymphoma, lymph nodes of multiple sites: Secondary | ICD-10-CM | POA: Diagnosis not present

## 2016-10-17 MED ORDER — PEGFILGRASTIM INJECTION 6 MG/0.6ML ~~LOC~~
6.0000 mg | PREFILLED_SYRINGE | Freq: Once | SUBCUTANEOUS | Status: AC
  Administered 2016-10-17: 6 mg via SUBCUTANEOUS
  Filled 2016-10-17: qty 0.6

## 2016-10-17 NOTE — Telephone Encounter (Signed)
Scheduled appt per 9/11 los - patient to get new scheduled next treatment date 9/25

## 2016-10-17 NOTE — Patient Instructions (Signed)
Pegfilgrastim injection What is this medicine? PEGFILGRASTIM (PEG fil gra stim) is a long-acting granulocyte colony-stimulating factor that stimulates the growth of neutrophils, a type of white blood cell important in the body's fight against infection. It is used to reduce the incidence of fever and infection in patients with certain types of cancer who are receiving chemotherapy that affects the bone marrow, and to increase survival after being exposed to high doses of radiation. This medicine may be used for other purposes; ask your health care provider or pharmacist if you have questions. COMMON BRAND NAME(S): Neulasta What should I tell my health care provider before I take this medicine? They need to know if you have any of these conditions: -kidney disease -latex allergy -ongoing radiation therapy -sickle cell disease -skin reactions to acrylic adhesives (On-Body Injector only) -an unusual or allergic reaction to pegfilgrastim, filgrastim, other medicines, foods, dyes, or preservatives -pregnant or trying to get pregnant -breast-feeding How should I use this medicine? This medicine is for injection under the skin. If you get this medicine at home, you will be taught how to prepare and give the pre-filled syringe or how to use the On-body Injector. Refer to the patient Instructions for Use for detailed instructions. Use exactly as directed. Tell your healthcare provider immediately if you suspect that the On-body Injector may not have performed as intended or if you suspect the use of the On-body Injector resulted in a missed or partial dose. It is important that you put your used needles and syringes in a special sharps container. Do not put them in a trash can. If you do not have a sharps container, call your pharmacist or healthcare provider to get one. Talk to your pediatrician regarding the use of this medicine in children. While this drug may be prescribed for selected conditions,  precautions do apply. Overdosage: If you think you have taken too much of this medicine contact a poison control center or emergency room at once. NOTE: This medicine is only for you. Do not share this medicine with others. What if I miss a dose? It is important not to miss your dose. Call your doctor or health care professional if you miss your dose. If you miss a dose due to an On-body Injector failure or leakage, a new dose should be administered as soon as possible using a single prefilled syringe for manual use. What may interact with this medicine? Interactions have not been studied. Give your health care provider a list of all the medicines, herbs, non-prescription drugs, or dietary supplements you use. Also tell them if you smoke, drink alcohol, or use illegal drugs. Some items may interact with your medicine. This list may not describe all possible interactions. Give your health care provider a list of all the medicines, herbs, non-prescription drugs, or dietary supplements you use. Also tell them if you smoke, drink alcohol, or use illegal drugs. Some items may interact with your medicine. What should I watch for while using this medicine? You may need blood work done while you are taking this medicine. If you are going to need a MRI, CT scan, or other procedure, tell your doctor that you are using this medicine (On-Body Injector only). What side effects may I notice from receiving this medicine? Side effects that you should report to your doctor or health care professional as soon as possible: -allergic reactions like skin rash, itching or hives, swelling of the face, lips, or tongue -dizziness -fever -pain, redness, or irritation at site   where injected -pinpoint red spots on the skin -red or dark-brown urine -shortness of breath or breathing problems -stomach or side pain, or pain at the shoulder -swelling -tiredness -trouble passing urine or change in the amount of urine Side  effects that usually do not require medical attention (report to your doctor or health care professional if they continue or are bothersome): -bone pain -muscle pain This list may not describe all possible side effects. Call your doctor for medical advice about side effects. You may report side effects to FDA at 1-800-FDA-1088. Where should I keep my medicine? Keep out of the reach of children. Store pre-filled syringes in a refrigerator between 2 and 8 degrees C (36 and 46 degrees F). Do not freeze. Keep in carton to protect from light. Throw away this medicine if it is left out of the refrigerator for more than 48 hours. Throw away any unused medicine after the expiration date. NOTE: This sheet is a summary. It may not cover all possible information. If you have questions about this medicine, talk to your doctor, pharmacist, or health care provider.  2018 Elsevier/Gold Standard (2016-01-17 12:58:03)  

## 2016-10-28 ENCOUNTER — Other Ambulatory Visit (HOSPITAL_BASED_OUTPATIENT_CLINIC_OR_DEPARTMENT_OTHER): Payer: 59

## 2016-10-28 ENCOUNTER — Ambulatory Visit (HOSPITAL_BASED_OUTPATIENT_CLINIC_OR_DEPARTMENT_OTHER): Payer: 59

## 2016-10-28 ENCOUNTER — Ambulatory Visit: Payer: 59

## 2016-10-28 ENCOUNTER — Telehealth: Payer: Self-pay | Admitting: Hematology

## 2016-10-28 VITALS — BP 128/70 | HR 86 | Temp 98.2°F | Resp 17

## 2016-10-28 DIAGNOSIS — Z95828 Presence of other vascular implants and grafts: Secondary | ICD-10-CM

## 2016-10-28 DIAGNOSIS — C8148 Lymphocyte-rich classical Hodgkin lymphoma, lymph nodes of multiple sites: Secondary | ICD-10-CM

## 2016-10-28 DIAGNOSIS — C8118 Nodular sclerosis classical Hodgkin lymphoma, lymph nodes of multiple sites: Secondary | ICD-10-CM

## 2016-10-28 DIAGNOSIS — Z5111 Encounter for antineoplastic chemotherapy: Secondary | ICD-10-CM

## 2016-10-28 LAB — CBC & DIFF AND RETIC
BASO%: 0.2 % (ref 0.0–2.0)
Basophils Absolute: 0 10*3/uL (ref 0.0–0.1)
EOS%: 0.4 % (ref 0.0–7.0)
Eosinophils Absolute: 0.1 10*3/uL (ref 0.0–0.5)
HCT: 31.6 % — ABNORMAL LOW (ref 34.8–46.6)
HGB: 10.2 g/dL — ABNORMAL LOW (ref 11.6–15.9)
IMMATURE RETIC FRACT: 16.1 % — AB (ref 1.60–10.00)
LYMPH%: 11.5 % — AB (ref 14.0–49.7)
MCH: 31.8 pg (ref 25.1–34.0)
MCHC: 32.3 g/dL (ref 31.5–36.0)
MCV: 98.4 fL (ref 79.5–101.0)
MONO#: 0.7 10*3/uL (ref 0.1–0.9)
MONO%: 5 % (ref 0.0–14.0)
NEUT#: 11.3 10*3/uL — ABNORMAL HIGH (ref 1.5–6.5)
NEUT%: 82.9 % — ABNORMAL HIGH (ref 38.4–76.8)
Platelets: 180 10*3/uL (ref 145–400)
RBC: 3.21 10*6/uL — AB (ref 3.70–5.45)
RDW: 16.3 % — AB (ref 11.2–14.5)
RETIC %: 5.44 % — AB (ref 0.70–2.10)
Retic Ct Abs: 174.62 10*3/uL — ABNORMAL HIGH (ref 33.70–90.70)
WBC: 13.6 10*3/uL — ABNORMAL HIGH (ref 3.9–10.3)
lymph#: 1.6 10*3/uL (ref 0.9–3.3)

## 2016-10-28 LAB — COMPREHENSIVE METABOLIC PANEL
ALT: 18 U/L (ref 0–55)
AST: 15 U/L (ref 5–34)
Albumin: 3.6 g/dL (ref 3.5–5.0)
Alkaline Phosphatase: 102 U/L (ref 40–150)
Anion Gap: 7 mEq/L (ref 3–11)
BUN: 4.2 mg/dL — AB (ref 7.0–26.0)
CO2: 26 meq/L (ref 22–29)
Calcium: 8.9 mg/dL (ref 8.4–10.4)
Chloride: 108 mEq/L (ref 98–109)
Creatinine: 1 mg/dL (ref 0.6–1.1)
EGFR: 66 mL/min/{1.73_m2} — AB (ref >90)
GLUCOSE: 104 mg/dL (ref 70–140)
POTASSIUM: 3.6 meq/L (ref 3.5–5.1)
SODIUM: 141 meq/L (ref 136–145)
TOTAL PROTEIN: 6.6 g/dL (ref 6.4–8.3)
Total Bilirubin: 0.33 mg/dL (ref 0.20–1.20)

## 2016-10-28 MED ORDER — VINBLASTINE SULFATE CHEMO INJECTION 1 MG/ML
6.0500 mg/m2 | Freq: Once | INTRAVENOUS | Status: AC
  Administered 2016-10-28: 13 mg via INTRAVENOUS
  Filled 2016-10-28: qty 13

## 2016-10-28 MED ORDER — SODIUM CHLORIDE 0.9 % IV SOLN
375.0000 mg/m2 | Freq: Once | INTRAVENOUS | Status: AC
  Administered 2016-10-28: 800 mg via INTRAVENOUS
  Filled 2016-10-28: qty 40

## 2016-10-28 MED ORDER — PALONOSETRON HCL INJECTION 0.25 MG/5ML
INTRAVENOUS | Status: AC
  Filled 2016-10-28: qty 5

## 2016-10-28 MED ORDER — SODIUM CHLORIDE 0.9% FLUSH
10.0000 mL | Freq: Once | INTRAVENOUS | Status: AC
  Administered 2016-10-28: 10 mL
  Filled 2016-10-28: qty 10

## 2016-10-28 MED ORDER — HEPARIN SOD (PORK) LOCK FLUSH 100 UNIT/ML IV SOLN
500.0000 [IU] | Freq: Once | INTRAVENOUS | Status: AC | PRN
  Administered 2016-10-28: 500 [IU]
  Filled 2016-10-28: qty 5

## 2016-10-28 MED ORDER — PALONOSETRON HCL INJECTION 0.25 MG/5ML
0.2500 mg | Freq: Once | INTRAVENOUS | Status: AC
  Administered 2016-10-28: 0.25 mg via INTRAVENOUS

## 2016-10-28 MED ORDER — SODIUM CHLORIDE 0.9 % IV SOLN
Freq: Once | INTRAVENOUS | Status: AC
  Administered 2016-10-28: 11:00:00 via INTRAVENOUS
  Filled 2016-10-28: qty 5

## 2016-10-28 MED ORDER — SODIUM CHLORIDE 0.9% FLUSH
3.0000 mL | INTRAVENOUS | Status: DC | PRN
  Filled 2016-10-28: qty 10

## 2016-10-28 MED ORDER — SODIUM CHLORIDE 0.9% FLUSH
10.0000 mL | INTRAVENOUS | Status: DC | PRN
  Administered 2016-10-28: 10 mL
  Filled 2016-10-28: qty 10

## 2016-10-28 MED ORDER — SODIUM CHLORIDE 0.9 % IV SOLN
Freq: Once | INTRAVENOUS | Status: AC
  Administered 2016-10-28: 11:00:00 via INTRAVENOUS

## 2016-10-28 MED ORDER — DOXORUBICIN HCL CHEMO IV INJECTION 2 MG/ML
25.0000 mg/m2 | Freq: Once | INTRAVENOUS | Status: AC
  Administered 2016-10-28: 54 mg via INTRAVENOUS
  Filled 2016-10-28: qty 27

## 2016-10-28 NOTE — Progress Notes (Signed)
Blood return noted before, every 3 cc and after Adriamycin push. Blood return noted before and after Vinblastine infusion.  

## 2016-10-28 NOTE — Patient Instructions (Signed)
[facility] Discharge Instructions for Patients Receiving Chemotherapy  Today you received the following chemotherapy agents Adriamycin,Vinblastine,Dacarbazine  To help prevent nausea and vomiting after your treatment, we encourage you to take your nausea medication as directed.   If you develop nausea and vomiting that is not controlled by your nausea medication, call the clinic.   BELOW ARE SYMPTOMS THAT SHOULD BE REPORTED IMMEDIATELY:  *FEVER GREATER THAN 100.5 F  *CHILLS WITH OR WITHOUT FEVER  NAUSEA AND VOMITING THAT IS NOT CONTROLLED WITH YOUR NAUSEA MEDICATION  *UNUSUAL SHORTNESS OF BREATH  *UNUSUAL BRUISING OR BLEEDING  TENDERNESS IN MOUTH AND THROAT WITH OR WITHOUT PRESENCE OF ULCERS  *URINARY PROBLEMS  *BOWEL PROBLEMS  UNUSUAL RASH Items with * indicate a potential emergency and should be followed up as soon as possible.  Feel free to call the clinic should you have any questions or concerns. The clinic phone number is [phone].  Please show the CHEMO ALERT CARD at check-in to the Emergency Department and triage nurse.   

## 2016-10-28 NOTE — Telephone Encounter (Signed)
Scheduled appt per 9/25 sch msg. Patient will get updated appt schedule in infusion.

## 2016-10-30 ENCOUNTER — Ambulatory Visit: Payer: 59

## 2016-10-31 ENCOUNTER — Ambulatory Visit (HOSPITAL_BASED_OUTPATIENT_CLINIC_OR_DEPARTMENT_OTHER): Payer: 59

## 2016-10-31 VITALS — BP 116/59 | HR 72 | Temp 97.9°F | Resp 18

## 2016-10-31 DIAGNOSIS — C8148 Lymphocyte-rich classical Hodgkin lymphoma, lymph nodes of multiple sites: Secondary | ICD-10-CM

## 2016-10-31 DIAGNOSIS — Z5189 Encounter for other specified aftercare: Secondary | ICD-10-CM

## 2016-10-31 DIAGNOSIS — C8118 Nodular sclerosis classical Hodgkin lymphoma, lymph nodes of multiple sites: Secondary | ICD-10-CM | POA: Diagnosis not present

## 2016-10-31 MED ORDER — PEGFILGRASTIM INJECTION 6 MG/0.6ML ~~LOC~~
6.0000 mg | PREFILLED_SYRINGE | Freq: Once | SUBCUTANEOUS | Status: AC
  Administered 2016-10-31: 6 mg via SUBCUTANEOUS
  Filled 2016-10-31: qty 0.6

## 2016-10-31 NOTE — Patient Instructions (Signed)
Pegfilgrastim injection What is this medicine? PEGFILGRASTIM (PEG fil gra stim) is a long-acting granulocyte colony-stimulating factor that stimulates the growth of neutrophils, a type of white blood cell important in the body's fight against infection. It is used to reduce the incidence of fever and infection in patients with certain types of cancer who are receiving chemotherapy that affects the bone marrow, and to increase survival after being exposed to high doses of radiation. This medicine may be used for other purposes; ask your health care provider or pharmacist if you have questions. COMMON BRAND NAME(S): Neulasta What should I tell my health care provider before I take this medicine? They need to know if you have any of these conditions: -kidney disease -latex allergy -ongoing radiation therapy -sickle cell disease -skin reactions to acrylic adhesives (On-Body Injector only) -an unusual or allergic reaction to pegfilgrastim, filgrastim, other medicines, foods, dyes, or preservatives -pregnant or trying to get pregnant -breast-feeding How should I use this medicine? This medicine is for injection under the skin. If you get this medicine at home, you will be taught how to prepare and give the pre-filled syringe or how to use the On-body Injector. Refer to the patient Instructions for Use for detailed instructions. Use exactly as directed. Tell your healthcare provider immediately if you suspect that the On-body Injector may not have performed as intended or if you suspect the use of the On-body Injector resulted in a missed or partial dose. It is important that you put your used needles and syringes in a special sharps container. Do not put them in a trash can. If you do not have a sharps container, call your pharmacist or healthcare provider to get one. Talk to your pediatrician regarding the use of this medicine in children. While this drug may be prescribed for selected conditions,  precautions do apply. Overdosage: If you think you have taken too much of this medicine contact a poison control center or emergency room at once. NOTE: This medicine is only for you. Do not share this medicine with others. What if I miss a dose? It is important not to miss your dose. Call your doctor or health care professional if you miss your dose. If you miss a dose due to an On-body Injector failure or leakage, a new dose should be administered as soon as possible using a single prefilled syringe for manual use. What may interact with this medicine? Interactions have not been studied. Give your health care provider a list of all the medicines, herbs, non-prescription drugs, or dietary supplements you use. Also tell them if you smoke, drink alcohol, or use illegal drugs. Some items may interact with your medicine. This list may not describe all possible interactions. Give your health care provider a list of all the medicines, herbs, non-prescription drugs, or dietary supplements you use. Also tell them if you smoke, drink alcohol, or use illegal drugs. Some items may interact with your medicine. What should I watch for while using this medicine? You may need blood work done while you are taking this medicine. If you are going to need a MRI, CT scan, or other procedure, tell your doctor that you are using this medicine (On-Body Injector only). What side effects may I notice from receiving this medicine? Side effects that you should report to your doctor or health care professional as soon as possible: -allergic reactions like skin rash, itching or hives, swelling of the face, lips, or tongue -dizziness -fever -pain, redness, or irritation at site   where injected -pinpoint red spots on the skin -red or dark-brown urine -shortness of breath or breathing problems -stomach or side pain, or pain at the shoulder -swelling -tiredness -trouble passing urine or change in the amount of urine Side  effects that usually do not require medical attention (report to your doctor or health care professional if they continue or are bothersome): -bone pain -muscle pain This list may not describe all possible side effects. Call your doctor for medical advice about side effects. You may report side effects to FDA at 1-800-FDA-1088. Where should I keep my medicine? Keep out of the reach of children. Store pre-filled syringes in a refrigerator between 2 and 8 degrees C (36 and 46 degrees F). Do not freeze. Keep in carton to protect from light. Throw away this medicine if it is left out of the refrigerator for more than 48 hours. Throw away any unused medicine after the expiration date. NOTE: This sheet is a summary. It may not cover all possible information. If you have questions about this medicine, talk to your doctor, pharmacist, or health care provider.  2018 Elsevier/Gold Standard (2016-01-17 12:58:03)  

## 2016-11-10 NOTE — Progress Notes (Signed)
Marland Kitchen    HEMATOLOGY/ONCOLOGY CLINIC NOTE  Date of Service: 11/11/2016   Patient Care Team: Donald Prose, MD as PCP - General (Family Medicine)  CHIEF COMPLAINTS/PURPOSE OF CONSULTATION:  F/u for management of Hodgkins lymphoma  HISTORY OF PRESENTING ILLNESS:   Rhonda Mccann is a wonderful 46 y.o. female who has been referred to [place] by Dr .Donald Prose, MD for evaluation and management of newly diagnosed Hodgkins Lymphoma.  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  Patient first reported noticing a lump in the left side of her neck in January 2018. She noted some mild progressive increase in this lump which appeared more prominent and she reported it to her primary care physician. Patient had an ultrasound of her neck on 07/07/2016 which showed a 1.9 x 0.8 x 1.4 cm lymph node with no distinct fatty hilum. This was subsequently biopsied under ultrasound guidance on 07/21/2016 and findings are consistent with classical Hodgkin's lymphoma nodular sclerosis subtype.  Patient notes no other overtly palpable lymphadenopathy noted. No fevers no chills no night sweats no unexpected weight loss. She reports no lethargy no anorexia. Overall feels quite well.  INTERVAL HISTORY  Pt presents to the office today for a follow-up accompanied by her husband. she  reports that she is doing well overall. She reports that her medications are well managed. She reports an intermittent tingling sensations on the bottom and tips of her feet as well as her fingers. She is going on a  retreat on the 2,3,4 of November 2018 and will reschedule her 11th infusion  From 11/4 to 12/08/2016.  No other acute new concerns and feels ready to proceed with C4D1 of AVD chemotherapy.  On review of systems, pt reports fatigue, intermittent tingling sensations on her feet and fingers and denies fever, chills, HA,  and any other acute symptoms.     MEDICAL HISTORY:  Past Medical History:  Diagnosis Date  . Depression   . Drug abuse 2001  EX alcohol abuse sober for 17 years  Donated left kidney in 2013 (single kidney) Scoliosis GERD  SURGICAL HISTORY: Past Surgical History:  Procedure Laterality Date  . IR FLUORO GUIDE PORT INSERTION RIGHT  08/05/2016  . IR US GUIDE VASC ACCESS RIGHT  08/05/2016  . NEPHRECTOMY Left 2015  . NEPHRECTOMY LIVING DONOR  2013  Breast Augmentation Surgery in 1994 (saline implants)  SOCIAL HISTORY: Social History   Social History  . Marital status: Married    Spouse name: N/A  . Number of children: N/A  . Years of education: N/A   Occupational History  . Not on file.   Social History Main Topics  . Smoking status: Former Smoker    Types: Cigarettes    Quit date: 1998  . Smokeless tobacco: Never Used     Comment: quit 17 yrs ago  . Alcohol use No  . Drug use: No     Comment: Previous drug user - quite 2001  . Sexual activity: Yes   Other Topics Concern  . Not on file   Social History Narrative  . No narrative on file  Works as a Production manager   FAMILY HISTORY: Paternal aunt had breast cancer Paternal grandfather had lymphoma  ALLERGIES:  is allergic to contrast media [iodinated diagnostic agents] and nsaids.  MEDICATIONS:  Current Outpatient Prescriptions  Medication Sig Dispense Refill  . buPROPion (WELLBUTRIN XL) 300  MG 24 hr tablet Take 1 tablet by mouth daily.    Marland Kitchen dexamethasone (DECADRON) 4 MG tablet Take 2 tablets (8 mg total) by mouth See admin instructions. 8mg  po with breakfast and lunch for 2 days after each chemotherapy 30 tablet 2  . HYDROcodone-acetaminophen (NORCO) 5-325 MG tablet Take 1-2 tablets by mouth every 6 (six) hours as needed for moderate pain. 30 tablet 0  . lidocaine-prilocaine (EMLA) cream Apply 1 application topically as needed. 30 g 0  . LORazepam (ATIVAN) 0.5 MG tablet Take 1 tablet (0.5 mg total) by mouth every 8  (eight) hours. 30 tablet 0  . magic mouthwash w/lidocaine SOLN Take 5 mLs by mouth 4 (four) times daily as needed for mouth pain. 1 part viscous lidocaine 2% 1 part maalox 1 part nystatin 500,000 IU per 67ml 120 mL 0  . omeprazole (PRILOSEC) 20 MG capsule Take 20 mg by mouth daily.    . ondansetron (ZOFRAN) 8 MG tablet Take 1 tablet (8 mg total) by mouth every 8 (eight) hours as needed for nausea or vomiting. 20 tablet 0  . prochlorperazine (COMPAZINE) 10 MG tablet Take 1 tablet (10 mg total) by mouth every 6 (six) hours as needed for nausea or vomiting. 30 tablet 0  . traZODone (DESYREL) 50 MG tablet Take 1 tablet (50 mg total) by mouth at bedtime as needed for sleep. 30 tablet 1   No current facility-administered medications for this visit.    Facility-Administered Medications Ordered in Other Visits  Medication Dose Route Frequency Provider Last Rate Last Dose  . dacarbazine (DTIC) 800 mg in sodium chloride 0.9 % 250 mL chemo infusion  375 mg/m2 (Treatment Plan Recorded) Intravenous Once Brunetta Genera, MD      . DOXOrubicin (ADRIAMYCIN) chemo injection 54 mg  25 mg/m2 (Treatment Plan Recorded) Intravenous Once Brunetta Genera, MD      . heparin lock flush 100 unit/mL  500 Units Intracatheter Once PRN Brunetta Genera, MD      . sodium chloride flush (NS) 0.9 % injection 10 mL  10 mL Intracatheter PRN Brunetta Genera, MD      . vinBLAStine (VELBAN) 13 mg in sodium chloride 0.9 % 50 mL chemo infusion  6.05 mg/m2 (Treatment Plan Recorded) Intravenous Once Brunetta Genera, MD        REVIEW OF SYSTEMS:    10 Point review of Systems was done is negative except as noted above.  PHYSICAL EXAMINATION: ECOG PERFORMANCE STATUS: 0 - Asymptomatic  .VS as per EPIC GENERAL:alert, in no acute distress and comfortable SKIN: no acute rashes, no significant lesions EYES: conjunctiva are pink and non-injected, sclera anicteric OROPHARYNX: MMM, no exudates, no oropharyngeal  erythema or ulceration NECK: supple, no JVD LYMPH: no palpable lymphadenopathy in the cervical,axillary or inguinal regions. Previous small cervical LN not palpable at this time. LUNGS: clear to auscultation b/l with normal respiratory effort HEART: regular rate & rhythm ABDOMEN:  normoactive bowel sounds , non tender, not distended. Extremity: no pedal edema PSYCH: alert & oriented x 3 with fluent speech NEURO: no focal motor/sensory deficits  LABORATORY DATA:  I have reviewed the data as listed  . CBC Latest Ref Rng & Units 11/11/2016 10/28/2016 10/14/2016  WBC 3.9 - 10.3 10e3/uL 12.6(H) 13.6(H) 11.7(H)  Hemoglobin 11.6 - 15.9 g/dL 9.6(L) 10.2(L) 11.4(L)  Hematocrit 34.8 - 46.6 % 30.7(L) 31.6(L) 34.0(L)  Platelets 145 - 400 10e3/uL 176 180 225   ANC 700 . CMP Latest Ref Rng & Units  11/11/2016 10/28/2016 10/14/2016  Glucose 70 - 140 mg/dl 94 104 108  BUN 7.0 - 26.0 mg/dL 6.9(L) 4.2(L) 5.5(L)  Creatinine 0.6 - 1.1 mg/dL 1.0 1.0 1.1  Sodium 136 - 145 mEq/L 139 141 140  Potassium 3.5 - 5.1 mEq/L 4.0 3.6 3.7  CO2 22 - 29 mEq/L 25 26 25   Calcium 8.4 - 10.4 mg/dL 9.0 8.9 9.0  Total Protein 6.4 - 8.3 g/dL 6.4 6.6 6.6  Total Bilirubin 0.20 - 1.20 mg/dL 0.29 0.33 0.31  Alkaline Phos 40 - 150 U/L 91 102 85  AST 5 - 34 U/L 11 15 14   ALT 0 - 55 U/L 17 18 18          RADIOGRAPHIC STUDIES: I have personally reviewed the radiological images as listed and agreed with the findings in the report. No results found.  ASSESSMENT & PLAN:   46 yo very pleasant caucasian female with   1) Stage IIA (adverse risk due to >3 regions of involvement, non bulky) Classical Hodgkins Lymphoma -Nodular Sclerosis Subtype No constitutional symptoms. Sed rate 18 (WNL) PET/CT reviewed ECHO EF 60-65% DLCO uncorrected 70% of predicted. 24h creatinine clearance WNL.  Patient is s/p 3 cycles of rx -PET from 10/07/2016 reviewed. Near complete metabolic response to therapy and no residual disease > Deauville  2  2) Grade 1 -nausea -resolved -patient has prn zofran and compazine.   3) Grade 1 mucositis - resolved -continue the use of oral cryotherapy during chemotherapy -continue salt/baking soda mouthwashes  4) Neutropenia from chemotherapy \. Now resolved with the use of neulasta. 5) Neulasta related bone pains -controlled with prn pain medications. Plan -continue Neulasta D3 and D17 every cycle -vicodin prn for bone pains. -daily loratadine for 5-7 days with each cycle to reduce Neulasta-related bone pains.  6) Insomnia - she has trouble sleeping some nights trazodone prn   7) Grade 1 neuropathy from ctx- resolving. Will monitor. Plan -Labs stable. No prohibitive toxicities-patient appropriate to proceed with C4D1 of treatment (AVD)  today, no bleomycin for the remaining 4 cycles -no prohibitive toxicities at this time that require overt treatment changes -continue neulasta support  8) Single kidney - patient donated left kidney in 2013 -avoid nephrotoxic medications -Creatinine stable  -continue AVD q2weeks (schedule all remaining infusions) -patient would like to schedule C6D1 on 12/5 instead of 12/4 for other scheduling needs.  -PET/CT in 10 weeks  -RTC with Dr Irene Limbo in 4 weeks with C5D1  with labs   All of the patients questions were answered with apparent satisfaction. The patient knows to call the clinic with any problems, questions or concerns.  I spent 20 minutes counseling the patient face to face. The total time spent in the appointment was 25 minutes and more than 50% was on counseling and direct patient cares.    Sullivan Lone MD [employer] AAHIVMS [facility] Hematology/Oncology Physician [facility]  (Office):       [phone] (Work cell):  [phone] (Fax):           [phone]   This document serves as a record of services personally performed by Sullivan Lone, MD. It was created on her behalf by Alean Rinne, a trained medical scribe. The creation  of this record is based on the scribe's personal observations and the provider's statements to them. This document has been checked and approved by the attending provider.

## 2016-11-11 ENCOUNTER — Ambulatory Visit (HOSPITAL_BASED_OUTPATIENT_CLINIC_OR_DEPARTMENT_OTHER): Payer: 59

## 2016-11-11 ENCOUNTER — Other Ambulatory Visit (HOSPITAL_BASED_OUTPATIENT_CLINIC_OR_DEPARTMENT_OTHER): Payer: 59

## 2016-11-11 ENCOUNTER — Ambulatory Visit (HOSPITAL_BASED_OUTPATIENT_CLINIC_OR_DEPARTMENT_OTHER): Payer: 59 | Admitting: Hematology

## 2016-11-11 VITALS — BP 113/62 | HR 80 | Temp 98.2°F | Resp 18

## 2016-11-11 DIAGNOSIS — G629 Polyneuropathy, unspecified: Secondary | ICD-10-CM | POA: Diagnosis not present

## 2016-11-11 DIAGNOSIS — C8148 Lymphocyte-rich classical Hodgkin lymphoma, lymph nodes of multiple sites: Secondary | ICD-10-CM

## 2016-11-11 DIAGNOSIS — C8110 Nodular sclerosis classical Hodgkin lymphoma, unspecified site: Secondary | ICD-10-CM

## 2016-11-11 DIAGNOSIS — G4700 Insomnia, unspecified: Secondary | ICD-10-CM

## 2016-11-11 DIAGNOSIS — R5383 Other fatigue: Secondary | ICD-10-CM | POA: Diagnosis not present

## 2016-11-11 DIAGNOSIS — Z5111 Encounter for antineoplastic chemotherapy: Secondary | ICD-10-CM | POA: Diagnosis not present

## 2016-11-11 DIAGNOSIS — T451X5A Adverse effect of antineoplastic and immunosuppressive drugs, initial encounter: Secondary | ICD-10-CM

## 2016-11-11 DIAGNOSIS — D6481 Anemia due to antineoplastic chemotherapy: Secondary | ICD-10-CM

## 2016-11-11 LAB — CBC & DIFF AND RETIC
BASO%: 0.2 % (ref 0.0–2.0)
Basophils Absolute: 0 10*3/uL (ref 0.0–0.1)
EOS ABS: 0.1 10*3/uL (ref 0.0–0.5)
EOS%: 0.8 % (ref 0.0–7.0)
HCT: 30.7 % — ABNORMAL LOW (ref 34.8–46.6)
HEMOGLOBIN: 9.6 g/dL — AB (ref 11.6–15.9)
IMMATURE RETIC FRACT: 21.7 % — AB (ref 1.60–10.00)
LYMPH%: 12 % — AB (ref 14.0–49.7)
MCH: 31.4 pg (ref 25.1–34.0)
MCHC: 31.3 g/dL — ABNORMAL LOW (ref 31.5–36.0)
MCV: 100.3 fL (ref 79.5–101.0)
MONO#: 0.7 10*3/uL (ref 0.1–0.9)
MONO%: 5.6 % (ref 0.0–14.0)
NEUT#: 10.3 10*3/uL — ABNORMAL HIGH (ref 1.5–6.5)
NEUT%: 81.4 % — AB (ref 38.4–76.8)
PLATELETS: 176 10*3/uL (ref 145–400)
RBC: 3.06 10*6/uL — ABNORMAL LOW (ref 3.70–5.45)
RDW: 17.2 % — ABNORMAL HIGH (ref 11.2–14.5)
Retic %: 7.48 % — ABNORMAL HIGH (ref 0.70–2.10)
Retic Ct Abs: 228.89 10*3/uL — ABNORMAL HIGH (ref 33.70–90.70)
WBC: 12.6 10*3/uL — ABNORMAL HIGH (ref 3.9–10.3)
lymph#: 1.5 10*3/uL (ref 0.9–3.3)

## 2016-11-11 LAB — COMPREHENSIVE METABOLIC PANEL
ALBUMIN: 3.5 g/dL (ref 3.5–5.0)
ALK PHOS: 91 U/L (ref 40–150)
ALT: 17 U/L (ref 0–55)
ANION GAP: 9 meq/L (ref 3–11)
AST: 11 U/L (ref 5–34)
BILIRUBIN TOTAL: 0.29 mg/dL (ref 0.20–1.20)
BUN: 6.9 mg/dL — ABNORMAL LOW (ref 7.0–26.0)
CO2: 25 mEq/L (ref 22–29)
Calcium: 9 mg/dL (ref 8.4–10.4)
Chloride: 106 mEq/L (ref 98–109)
Creatinine: 1 mg/dL (ref 0.6–1.1)
EGFR: 65 mL/min/{1.73_m2} — AB (ref >90)
GLUCOSE: 94 mg/dL (ref 70–140)
Potassium: 4 mEq/L (ref 3.5–5.1)
SODIUM: 139 meq/L (ref 136–145)
TOTAL PROTEIN: 6.4 g/dL (ref 6.4–8.3)

## 2016-11-11 MED ORDER — PALONOSETRON HCL INJECTION 0.25 MG/5ML
0.2500 mg | Freq: Once | INTRAVENOUS | Status: AC
  Administered 2016-11-11: 0.25 mg via INTRAVENOUS

## 2016-11-11 MED ORDER — SODIUM CHLORIDE 0.9 % IV SOLN
Freq: Once | INTRAVENOUS | Status: AC
  Administered 2016-11-11: 10:00:00 via INTRAVENOUS
  Filled 2016-11-11: qty 5

## 2016-11-11 MED ORDER — HEPARIN SOD (PORK) LOCK FLUSH 100 UNIT/ML IV SOLN
500.0000 [IU] | Freq: Once | INTRAVENOUS | Status: AC | PRN
  Administered 2016-11-11: 500 [IU]
  Filled 2016-11-11: qty 5

## 2016-11-11 MED ORDER — HYDROCODONE-ACETAMINOPHEN 5-325 MG PO TABS: 1.0000 | ORAL_TABLET | Freq: Four times a day (QID) | ORAL | 0 refills | Status: DC | PRN

## 2016-11-11 MED ORDER — SODIUM CHLORIDE 0.9 % IV SOLN
375.0000 mg/m2 | Freq: Once | INTRAVENOUS | Status: AC
  Administered 2016-11-11: 800 mg via INTRAVENOUS
  Filled 2016-11-11: qty 40

## 2016-11-11 MED ORDER — PALONOSETRON HCL INJECTION 0.25 MG/5ML
INTRAVENOUS | Status: AC
  Filled 2016-11-11: qty 5

## 2016-11-11 MED ORDER — SODIUM CHLORIDE 0.9 % IV SOLN
Freq: Once | INTRAVENOUS | Status: AC
  Administered 2016-11-11: 09:00:00 via INTRAVENOUS

## 2016-11-11 MED ORDER — DOXORUBICIN HCL CHEMO IV INJECTION 2 MG/ML
25.0000 mg/m2 | Freq: Once | INTRAVENOUS | Status: AC
  Administered 2016-11-11: 54 mg via INTRAVENOUS
  Filled 2016-11-11: qty 27

## 2016-11-11 MED ORDER — SODIUM CHLORIDE 0.9% FLUSH
10.0000 mL | INTRAVENOUS | Status: DC | PRN
  Administered 2016-11-11: 10 mL
  Filled 2016-11-11: qty 10

## 2016-11-11 MED ORDER — VINBLASTINE SULFATE CHEMO INJECTION 1 MG/ML
6.0500 mg/m2 | Freq: Once | INTRAVENOUS | Status: AC
  Administered 2016-11-11: 13 mg via INTRAVENOUS
  Filled 2016-11-11: qty 13

## 2016-11-11 NOTE — Patient Instructions (Signed)
Dacarbazine, DTIC injection What is this medicine? DACARBAZINE (da KAR ba zeen) is a chemotherapy drug. This medicine is used to treat skin cancer. It is also used with other medicines to treat Hodgkin's disease. This medicine may be used for other purposes; ask your health care provider or pharmacist if you have questions. COMMON BRAND NAME(S): DTIC-Dome What should I tell my health care provider before I take this medicine? They need to know if you have any of these conditions: -infection (especially virus infection such as chickenpox, cold sores, or herpes) -kidney disease -liver disease -low blood counts like low platelets, red blood cells, white blood cells -recent radiation therapy -an unusual or allergic reaction to dacarbazine, other chemotherapy agents, other medicines, foods, dyes, or preservatives -pregnant or trying to get pregnant -breast-feeding How should I use this medicine? This drug is given as an injection or infusion into a vein. It is administered in a hospital or clinic by a specially trained health care professional. Talk to your pediatrician regarding the use of this medicine in children. While this drug may be prescribed for selected conditions, precautions do apply. Overdosage: If you think you have taken too much of this medicine contact a poison control center or emergency room at once. NOTE: This medicine is only for you. Do not share this medicine with others. What if I miss a dose? It is important not to miss your dose. Call your doctor or health care professional if you are unable to keep an appointment. What may interact with this medicine? -medicines to increase blood counts like filgrastim, pegfilgrastim, sargramostim -vaccines This list may not describe all possible interactions. Give your health care provider a list of all the medicines, herbs, non-prescription drugs, or dietary supplements you use. Also tell them if you smoke, drink alcohol, or use  illegal drugs. Some items may interact with your medicine. What should I watch for while using this medicine? Your condition will be monitored carefully while you are receiving this medicine. You will need important blood work done while you are taking this medicine. This drug may make you feel generally unwell. This is not uncommon, as chemotherapy can affect healthy cells as well as cancer cells. Report any side effects. Continue your course of treatment even though you feel ill unless your doctor tells you to stop. Call your doctor or health care professional for advice if you get a fever, chills or sore throat, or other symptoms of a cold or flu. Do not treat yourself. This drug decreases your body's ability to fight infections. Try to avoid being around people who are sick. This medicine may increase your risk to bruise or bleed. Call your doctor or health care professional if you notice any unusual bleeding. Talk to your doctor about your risk of cancer. You may be more at risk for certain types of cancers if you take this medicine. Do not become pregnant while taking this medicine. Women should inform their doctor if they wish to become pregnant or think they might be pregnant. There is a potential for serious side effects to an unborn child. Talk to your health care professional or pharmacist for more information. Do not breast-feed an infant while taking this medicine. What side effects may I notice from receiving this medicine? Side effects that you should report to your doctor or health care professional as soon as possible: -allergic reactions like skin rash, itching or hives, swelling of the face, lips, or tongue -low blood counts - this medicine may  decrease the number of white blood cells, red blood cells and platelets. You may be at increased risk for infections and bleeding. -signs of infection - fever or chills, cough, sore throat, pain or difficulty passing urine -signs of decreased  platelets or bleeding - bruising, pinpoint red spots on the skin, black, tarry stools, blood in the urine -signs of decreased red blood cells - unusually weak or tired, fainting spells, lightheadedness -breathing problems -muscle pains -pain at site where injected -trouble passing urine or change in the amount of urine -vomiting -yellowing of the eyes or skin Side effects that usually do not require medical attention (report to your doctor or health care professional if they continue or are bothersome): -diarrhea -hair loss -loss of appetite -nausea -skin more sensitive to sun or ultraviolet light -stomach upset This list may not describe all possible side effects. Call your doctor for medical advice about side effects. You may report side effects to FDA at 1-800-FDA-1088. Where should I keep my medicine? This drug is given in a hospital or clinic and will not be stored at home. NOTE: This sheet is a summary. It may not cover all possible information. If you have questions about this medicine, talk to your doctor, pharmacist, or health care provider.  2018 Elsevier/Gold Standard (2015-03-23 15:17:39) Vinblastine injection What is this medicine? VINBLASTINE (vin BLAS teen) is a chemotherapy drug. It slows the growth of cancer cells. This medicine is used to treat many types of cancer like breast cancer, testicular cancer, Hodgkin's disease, non-Hodgkin's lymphoma, and sarcoma. This medicine may be used for other purposes; ask your health care provider or pharmacist if you have questions. COMMON BRAND NAME(S): Velban What should I tell my health care provider before I take this medicine? They need to know if you have any of these conditions: -blood disorders -dental disease -gout -infection (especially a virus infection such as chickenpox, cold sores, or herpes) -liver disease -lung disease -nervous system disease -recent or ongoing radiation therapy -an unusual or allergic reaction  to vinblastine, other chemotherapy agents, other medicines, foods, dyes, or preservatives -pregnant or trying to get pregnant -breast-feeding How should I use this medicine? This drug is given as an infusion into a vein. It is administered in a hospital or clinic by a specially trained health care professional. If you have pain, swelling, burning or any unusual feeling around the site of your injection, tell your health care professional right away. Talk to your pediatrician regarding the use of this medicine in children. While this drug may be prescribed for selected conditions, precautions do apply. Overdosage: If you think you have taken too much of this medicine contact a poison control center or emergency room at once. NOTE: This medicine is only for you. Do not share this medicine with others. What if I miss a dose? It is important not to miss your dose. Call your doctor or health care professional if you are unable to keep an appointment. What may interact with this medicine? Do not take this medicine with any of the following medications: -erythromycin -itraconazole -mibefradil -voriconazole This medicine may also interact with the following medications: -cyclosporine -fluconazole -ketoconazole -medicines for seizures like phenytoin -medicines to increase blood counts like filgrastim, pegfilgrastim, sargramostim -vaccines -verapamil Talk to your doctor or health care professional before taking any of these medicines: -acetaminophen -aspirin -ibuprofen -ketoprofen -naproxen This list may not describe all possible interactions. Give your health care provider a list of all the medicines, herbs, non-prescription drugs, or dietary  supplements you use. Also tell them if you smoke, drink alcohol, or use illegal drugs. Some items may interact with your medicine. What should I watch for while using this medicine? Your condition will be monitored carefully while you are receiving this  medicine. You will need important blood work done while you are taking this medicine. This drug may make you feel generally unwell. This is not uncommon, as chemotherapy can affect healthy cells as well as cancer cells. Report any side effects. Continue your course of treatment even though you feel ill unless your doctor tells you to stop. In some cases, you may be given additional medicines to help with side effects. Follow all directions for their use. Call your doctor or health care professional for advice if you get a fever, chills or sore throat, or other symptoms of a cold or flu. Do not treat yourself. This drug decreases your body's ability to fight infections. Try to avoid being around people who are sick. This medicine may increase your risk to bruise or bleed. Call your doctor or health care professional if you notice any unusual bleeding. Be careful brushing and flossing your teeth or using a toothpick because you may get an infection or bleed more easily. If you have any dental work done, tell your dentist you are receiving this medicine. Avoid taking products that contain aspirin, acetaminophen, ibuprofen, naproxen, or ketoprofen unless instructed by your doctor. These medicines may hide a fever. Do not become pregnant while taking this medicine. Women should inform their doctor if they wish to become pregnant or think they might be pregnant. There is a potential for serious side effects to an unborn child. Talk to your health care professional or pharmacist for more information. Do not breast-feed an infant while taking this medicine. Men may have a lower sperm count while taking this medicine. Talk to your doctor if you plan to father a child. What side effects may I notice from receiving this medicine? Side effects that you should report to your doctor or health care professional as soon as possible: -allergic reactions like skin rash, itching or hives, swelling of the face, lips, or  tongue -low blood counts - This drug may decrease the number of white blood cells, red blood cells and platelets. You may be at increased risk for infections and bleeding. -signs of infection - fever or chills, cough, sore throat, pain or difficulty passing urine -signs of decreased platelets or bleeding - bruising, pinpoint red spots on the skin, black, tarry stools, nosebleeds -signs of decreased red blood cells - unusually weak or tired, fainting spells, lightheadedness -breathing problems -changes in hearing -change in the amount of urine -chest pain -high blood pressure -mouth sores -nausea and vomiting -pain, swelling, redness or irritation at the injection site -pain, tingling, numbness in the hands or feet -problems with balance, dizziness -seizures Side effects that usually do not require medical attention (report to your doctor or health care professional if they continue or are bothersome): -constipation -hair loss -jaw pain -loss of appetite -sensitivity to light -stomach pain -tumor pain This list may not describe all possible side effects. Call your doctor for medical advice about side effects. You may report side effects to FDA at 1-800-FDA-1088. Where should I keep my medicine? This drug is given in a hospital or clinic and will not be stored at home. NOTE: This sheet is a summary. It may not cover all possible information. If you have questions about this medicine, talk to  your doctor, pharmacist, or health care provider.  2018 Elsevier/Gold Standard (2007-10-18 17:15:59) Doxorubicin injection What is this medicine? DOXORUBICIN (dox oh ROO bi sin) is a chemotherapy drug. It is used to treat many kinds of cancer like leukemia, lymphoma, neuroblastoma, sarcoma, and Wilms' tumor. It is also used to treat bladder cancer, breast cancer, lung cancer, ovarian cancer, stomach cancer, and thyroid cancer. This medicine may be used for other purposes; ask your health care  provider or pharmacist if you have questions. COMMON BRAND NAME(S): Adriamycin, Adriamycin PFS, Adriamycin RDF, Rubex What should I tell my health care provider before I take this medicine? They need to know if you have any of these conditions: -heart disease -history of low blood counts caused by a medicine -liver disease -recent or ongoing radiation therapy -an unusual or allergic reaction to doxorubicin, other chemotherapy agents, other medicines, foods, dyes, or preservatives -pregnant or trying to get pregnant -breast-feeding How should I use this medicine? This drug is given as an infusion into a vein. It is administered in a hospital or clinic by a specially trained health care professional. If you have pain, swelling, burning or any unusual feeling around the site of your injection, tell your health care professional right away. Talk to your pediatrician regarding the use of this medicine in children. Special care may be needed. Overdosage: If you think you have taken too much of this medicine contact a poison control center or emergency room at once. NOTE: This medicine is only for you. Do not share this medicine with others. What if I miss a dose? It is important not to miss your dose. Call your doctor or health care professional if you are unable to keep an appointment. What may interact with this medicine? This medicine may interact with the following medications: -6-mercaptopurine -paclitaxel -phenytoin -St. John's Wort -trastuzumab -verapamil This list may not describe all possible interactions. Give your health care provider a list of all the medicines, herbs, non-prescription drugs, or dietary supplements you use. Also tell them if you smoke, drink alcohol, or use illegal drugs. Some items may interact with your medicine. What should I watch for while using this medicine? This drug may make you feel generally unwell. This is not uncommon, as chemotherapy can affect healthy  cells as well as cancer cells. Report any side effects. Continue your course of treatment even though you feel ill unless your doctor tells you to stop. There is a maximum amount of this medicine you should receive throughout your life. The amount depends on the medical condition being treated and your overall health. Your doctor will watch how much of this medicine you receive in your lifetime. Tell your doctor if you have taken this medicine before. You may need blood work done while you are taking this medicine. Your urine may turn red for a few days after your dose. This is not blood. If your urine is dark or brown, call your doctor. In some cases, you may be given additional medicines to help with side effects. Follow all directions for their use. Call your doctor or health care professional for advice if you get a fever, chills or sore throat, or other symptoms of a cold or flu. Do not treat yourself. This drug decreases your body's ability to fight infections. Try to avoid being around people who are sick. This medicine may increase your risk to bruise or bleed. Call your doctor or health care professional if you notice any unusual bleeding. Talk to your  doctor about your risk of cancer. You may be more at risk for certain types of cancers if you take this medicine. Do not become pregnant while taking this medicine or for 6 months after stopping it. Women should inform their doctor if they wish to become pregnant or think they might be pregnant. Men should not father a child while taking this medicine and for 6 months after stopping it. There is a potential for serious side effects to an unborn child. Talk to your health care professional or pharmacist for more information. Do not breast-feed an infant while taking this medicine. This medicine has caused ovarian failure in some women and reduced sperm counts in some men This medicine may interfere with the ability to have a child. Talk with your  doctor or health care professional if you are concerned about your fertility. What side effects may I notice from receiving this medicine? Side effects that you should report to your doctor or health care professional as soon as possible: -allergic reactions like skin rash, itching or hives, swelling of the face, lips, or tongue -breathing problems -chest pain -fast or irregular heartbeat -low blood counts - this medicine may decrease the number of white blood cells, red blood cells and platelets. You may be at increased risk for infections and bleeding. -pain, redness, or irritation at site where injected -signs of infection - fever or chills, cough, sore throat, pain or difficulty passing urine -signs of decreased platelets or bleeding - bruising, pinpoint red spots on the skin, black, tarry stools, blood in the urine -swelling of the ankles, feet, hands -tiredness -weakness Side effects that usually do not require medical attention (report to your doctor or health care professional if they continue or are bothersome): -diarrhea -hair loss -mouth sores -nail discoloration or damage -nausea -red colored urine -vomiting This list may not describe all possible side effects. Call your doctor for medical advice about side effects. You may report side effects to FDA at 1-800-FDA-1088. Where should I keep my medicine? This drug is given in a hospital or clinic and will not be stored at home. NOTE: This sheet is a summary. It may not cover all possible information. If you have questions about this medicine, talk to your doctor, pharmacist, or health care provider.  2018 Elsevier/Gold Standard (2015-03-19 11:28:51)

## 2016-11-11 NOTE — Progress Notes (Signed)
Patient with excellent blood return before, during and after Adriamycin push.  And also checked before Vinvblastine

## 2016-11-13 ENCOUNTER — Ambulatory Visit (HOSPITAL_BASED_OUTPATIENT_CLINIC_OR_DEPARTMENT_OTHER): Payer: 59

## 2016-11-13 ENCOUNTER — Telehealth: Payer: Self-pay | Admitting: Hematology

## 2016-11-13 VITALS — BP 119/67 | HR 90 | Temp 97.0°F | Resp 20

## 2016-11-13 DIAGNOSIS — Z5189 Encounter for other specified aftercare: Secondary | ICD-10-CM

## 2016-11-13 DIAGNOSIS — C8148 Lymphocyte-rich classical Hodgkin lymphoma, lymph nodes of multiple sites: Secondary | ICD-10-CM

## 2016-11-13 MED ORDER — PEGFILGRASTIM INJECTION 6 MG/0.6ML ~~LOC~~
6.0000 mg | PREFILLED_SYRINGE | Freq: Once | SUBCUTANEOUS | Status: AC
  Administered 2016-11-13: 6 mg via SUBCUTANEOUS
  Filled 2016-11-13: qty 0.6

## 2016-11-13 NOTE — Patient Instructions (Signed)
Pegfilgrastim injection What is this medicine? PEGFILGRASTIM (PEG fil gra stim) is a long-acting granulocyte colony-stimulating factor that stimulates the growth of neutrophils, a type of white blood cell important in the body's fight against infection. It is used to reduce the incidence of fever and infection in patients with certain types of cancer who are receiving chemotherapy that affects the bone marrow, and to increase survival after being exposed to high doses of radiation. This medicine may be used for other purposes; ask your health care provider or pharmacist if you have questions. COMMON BRAND NAME(S): Neulasta What should I tell my health care provider before I take this medicine? They need to know if you have any of these conditions: -kidney disease -latex allergy -ongoing radiation therapy -sickle cell disease -skin reactions to acrylic adhesives (On-Body Injector only) -an unusual or allergic reaction to pegfilgrastim, filgrastim, other medicines, foods, dyes, or preservatives -pregnant or trying to get pregnant -breast-feeding How should I use this medicine? This medicine is for injection under the skin. If you get this medicine at home, you will be taught how to prepare and give the pre-filled syringe or how to use the On-body Injector. Refer to the patient Instructions for Use for detailed instructions. Use exactly as directed. Tell your healthcare provider immediately if you suspect that the On-body Injector may not have performed as intended or if you suspect the use of the On-body Injector resulted in a missed or partial dose. It is important that you put your used needles and syringes in a special sharps container. Do not put them in a trash can. If you do not have a sharps container, call your pharmacist or healthcare provider to get one. Talk to your pediatrician regarding the use of this medicine in children. While this drug may be prescribed for selected conditions,  precautions do apply. Overdosage: If you think you have taken too much of this medicine contact a poison control center or emergency room at once. NOTE: This medicine is only for you. Do not share this medicine with others. What if I miss a dose? It is important not to miss your dose. Call your doctor or health care professional if you miss your dose. If you miss a dose due to an On-body Injector failure or leakage, a new dose should be administered as soon as possible using a single prefilled syringe for manual use. What may interact with this medicine? Interactions have not been studied. Give your health care provider a list of all the medicines, herbs, non-prescription drugs, or dietary supplements you use. Also tell them if you smoke, drink alcohol, or use illegal drugs. Some items may interact with your medicine. This list may not describe all possible interactions. Give your health care provider a list of all the medicines, herbs, non-prescription drugs, or dietary supplements you use. Also tell them if you smoke, drink alcohol, or use illegal drugs. Some items may interact with your medicine. What should I watch for while using this medicine? You may need blood work done while you are taking this medicine. If you are going to need a MRI, CT scan, or other procedure, tell your doctor that you are using this medicine (On-Body Injector only). What side effects may I notice from receiving this medicine? Side effects that you should report to your doctor or health care professional as soon as possible: -allergic reactions like skin rash, itching or hives, swelling of the face, lips, or tongue -dizziness -fever -pain, redness, or irritation at site   where injected -pinpoint red spots on the skin -red or dark-brown urine -shortness of breath or breathing problems -stomach or side pain, or pain at the shoulder -swelling -tiredness -trouble passing urine or change in the amount of urine Side  effects that usually do not require medical attention (report to your doctor or health care professional if they continue or are bothersome): -bone pain -muscle pain This list may not describe all possible side effects. Call your doctor for medical advice about side effects. You may report side effects to FDA at 1-800-FDA-1088. Where should I keep my medicine? Keep out of the reach of children. Store pre-filled syringes in a refrigerator between 2 and 8 degrees C (36 and 46 degrees F). Do not freeze. Keep in carton to protect from light. Throw away this medicine if it is left out of the refrigerator for more than 48 hours. Throw away any unused medicine after the expiration date. NOTE: This sheet is a summary. It may not cover all possible information. If you have questions about this medicine, talk to your doctor, pharmacist, or health care provider.  2018 Elsevier/Gold Standard (2016-01-17 12:58:03)  

## 2016-11-13 NOTE — Telephone Encounter (Signed)
Spoke with patient regarding appts that were scheduled per 10/9 los.  °

## 2016-11-21 ENCOUNTER — Other Ambulatory Visit: Payer: Self-pay

## 2016-11-21 ENCOUNTER — Other Ambulatory Visit: Payer: Self-pay | Admitting: Hematology

## 2016-11-21 MED ORDER — TRAZODONE HCL 50 MG PO TABS: 50.0000 mg | ORAL_TABLET | Freq: Every evening | ORAL | 0 refills | Status: DC | PRN

## 2016-11-25 ENCOUNTER — Ambulatory Visit (HOSPITAL_BASED_OUTPATIENT_CLINIC_OR_DEPARTMENT_OTHER): Payer: 59

## 2016-11-25 ENCOUNTER — Other Ambulatory Visit: Payer: Self-pay

## 2016-11-25 ENCOUNTER — Ambulatory Visit: Payer: 59

## 2016-11-25 ENCOUNTER — Other Ambulatory Visit (HOSPITAL_BASED_OUTPATIENT_CLINIC_OR_DEPARTMENT_OTHER): Payer: 59

## 2016-11-25 VITALS — BP 124/71 | HR 91 | Temp 98.5°F | Resp 17

## 2016-11-25 DIAGNOSIS — Z5111 Encounter for antineoplastic chemotherapy: Secondary | ICD-10-CM

## 2016-11-25 DIAGNOSIS — C8148 Lymphocyte-rich classical Hodgkin lymphoma, lymph nodes of multiple sites: Secondary | ICD-10-CM

## 2016-11-25 LAB — COMPREHENSIVE METABOLIC PANEL
ALT: 22 U/L (ref 0–55)
ANION GAP: 11 meq/L (ref 3–11)
AST: 16 U/L (ref 5–34)
Albumin: 3.6 g/dL (ref 3.5–5.0)
Alkaline Phosphatase: 83 U/L (ref 40–150)
BILIRUBIN TOTAL: 0.31 mg/dL (ref 0.20–1.20)
BUN: 5.5 mg/dL — ABNORMAL LOW (ref 7.0–26.0)
CALCIUM: 9.2 mg/dL (ref 8.4–10.4)
CO2: 23 mEq/L (ref 22–29)
CREATININE: 1 mg/dL (ref 0.6–1.1)
Chloride: 107 mEq/L (ref 98–109)
EGFR: >60 mL/min/{1.73_m2} (ref >60)
Glucose: 105 mg/dl (ref 70–140)
Potassium: 3.6 mEq/L (ref 3.5–5.1)
Sodium: 141 mEq/L (ref 136–145)
TOTAL PROTEIN: 6.3 g/dL — AB (ref 6.4–8.3)

## 2016-11-25 LAB — CBC & DIFF AND RETIC
BASO%: 0.3 % (ref 0.0–2.0)
BASOS ABS: 0 10*3/uL (ref 0.0–0.1)
EOS ABS: 0 10*3/uL (ref 0.0–0.5)
EOS%: 0.3 % (ref 0.0–7.0)
HCT: 31.8 % — ABNORMAL LOW (ref 34.8–46.6)
HEMOGLOBIN: 9.9 g/dL — AB (ref 11.6–15.9)
IMMATURE RETIC FRACT: 25.4 % — AB (ref 1.60–10.00)
LYMPH%: 15.2 % (ref 14.0–49.7)
MCH: 31.7 pg (ref 25.1–34.0)
MCHC: 31.1 g/dL — AB (ref 31.5–36.0)
MCV: 101.9 fL — ABNORMAL HIGH (ref 79.5–101.0)
MONO#: 0.6 10*3/uL (ref 0.1–0.9)
MONO%: 5.4 % (ref 0.0–14.0)
NEUT#: 9.1 10*3/uL — ABNORMAL HIGH (ref 1.5–6.5)
NEUT%: 78.8 % — ABNORMAL HIGH (ref 38.4–76.8)
Platelets: 186 10*3/uL (ref 145–400)
RBC: 3.12 10*6/uL — AB (ref 3.70–5.45)
RDW: 17.7 % — AB (ref 11.2–14.5)
RETIC %: 7.61 % — AB (ref 0.70–2.10)
RETIC CT ABS: 237.43 10*3/uL — AB (ref 33.70–90.70)
WBC: 11.6 10*3/uL — ABNORMAL HIGH (ref 3.9–10.3)
lymph#: 1.8 10*3/uL (ref 0.9–3.3)

## 2016-11-25 MED ORDER — PALONOSETRON HCL INJECTION 0.25 MG/5ML
INTRAVENOUS | Status: AC
  Filled 2016-11-25: qty 5

## 2016-11-25 MED ORDER — SODIUM CHLORIDE 0.9% FLUSH
10.0000 mL | Freq: Once | INTRAVENOUS | Status: AC
  Administered 2016-11-25: 10 mL
  Filled 2016-11-25: qty 10

## 2016-11-25 MED ORDER — SODIUM CHLORIDE 0.9 % IV SOLN
Freq: Once | INTRAVENOUS | Status: AC
  Administered 2016-11-25: 10:00:00 via INTRAVENOUS
  Filled 2016-11-25: qty 5

## 2016-11-25 MED ORDER — SODIUM CHLORIDE 0.9 % IV SOLN
Freq: Once | INTRAVENOUS | Status: DC
  Filled 2016-11-25: qty 5

## 2016-11-25 MED ORDER — HEPARIN SOD (PORK) LOCK FLUSH 100 UNIT/ML IV SOLN
500.0000 [IU] | Freq: Once | INTRAVENOUS | Status: AC | PRN
  Administered 2016-11-25: 500 [IU]
  Filled 2016-11-25: qty 5

## 2016-11-25 MED ORDER — ONDANSETRON HCL 8 MG PO TABS: 8.0000 mg | ORAL_TABLET | Freq: Three times a day (TID) | ORAL | 1 refills | Status: DC | PRN

## 2016-11-25 MED ORDER — SODIUM CHLORIDE 0.9 % IV SOLN
Freq: Once | INTRAVENOUS | Status: AC
  Administered 2016-11-25: 10:00:00 via INTRAVENOUS

## 2016-11-25 MED ORDER — DOXORUBICIN HCL CHEMO IV INJECTION 2 MG/ML
25.0000 mg/m2 | Freq: Once | INTRAVENOUS | Status: AC
  Administered 2016-11-25: 54 mg via INTRAVENOUS
  Filled 2016-11-25: qty 27

## 2016-11-25 MED ORDER — PALONOSETRON HCL INJECTION 0.25 MG/5ML
0.2500 mg | Freq: Once | INTRAVENOUS | Status: AC
  Administered 2016-11-25: 0.25 mg via INTRAVENOUS

## 2016-11-25 MED ORDER — VINBLASTINE SULFATE CHEMO INJECTION 1 MG/ML
6.0500 mg/m2 | Freq: Once | INTRAVENOUS | Status: AC
  Administered 2016-11-25: 13 mg via INTRAVENOUS
  Filled 2016-11-25: qty 13

## 2016-11-25 MED ORDER — SODIUM CHLORIDE 0.9 % IV SOLN
375.0000 mg/m2 | Freq: Once | INTRAVENOUS | Status: AC
  Administered 2016-11-25: 800 mg via INTRAVENOUS
  Filled 2016-11-25: qty 40

## 2016-11-25 MED ORDER — SODIUM CHLORIDE 0.9% FLUSH
10.0000 mL | INTRAVENOUS | Status: DC | PRN
  Administered 2016-11-25: 10 mL
  Filled 2016-11-25: qty 10

## 2016-11-25 NOTE — Patient Instructions (Signed)
[facility] Discharge Instructions for Patients Receiving Chemotherapy  Today you received the following chemotherapy agents:  Adriamycin, Vinblastine, Dacarbazine  To help prevent nausea and vomiting after your treatment, we encourage you to take your nausea medication as prescribed.   If you develop nausea and vomiting that is not controlled by your nausea medication, call the clinic.   BELOW ARE SYMPTOMS THAT SHOULD BE REPORTED IMMEDIATELY:  *FEVER GREATER THAN 100.5 F  *CHILLS WITH OR WITHOUT FEVER  NAUSEA AND VOMITING THAT IS NOT CONTROLLED WITH YOUR NAUSEA MEDICATION  *UNUSUAL SHORTNESS OF BREATH  *UNUSUAL BRUISING OR BLEEDING  TENDERNESS IN MOUTH AND THROAT WITH OR WITHOUT PRESENCE OF ULCERS  *URINARY PROBLEMS  *BOWEL PROBLEMS  UNUSUAL RASH Items with * indicate a potential emergency and should be followed up as soon as possible.  Feel free to call the clinic should you have any questions or concerns. The clinic phone number is [phone].  Please show the CHEMO ALERT CARD at check-in to the Emergency Department and triage nurse.   

## 2016-11-27 ENCOUNTER — Ambulatory Visit (HOSPITAL_BASED_OUTPATIENT_CLINIC_OR_DEPARTMENT_OTHER): Payer: 59

## 2016-11-27 VITALS — BP 132/77 | HR 100 | Temp 97.5°F | Resp 18

## 2016-11-27 DIAGNOSIS — C8148 Lymphocyte-rich classical Hodgkin lymphoma, lymph nodes of multiple sites: Secondary | ICD-10-CM | POA: Diagnosis not present

## 2016-11-27 DIAGNOSIS — Z5189 Encounter for other specified aftercare: Secondary | ICD-10-CM | POA: Diagnosis not present

## 2016-11-27 MED ORDER — PEGFILGRASTIM INJECTION 6 MG/0.6ML ~~LOC~~
6.0000 mg | PREFILLED_SYRINGE | Freq: Once | SUBCUTANEOUS | Status: AC
  Administered 2016-11-27: 6 mg via SUBCUTANEOUS
  Filled 2016-11-27: qty 0.6

## 2016-11-27 NOTE — Patient Instructions (Signed)
Pegfilgrastim injection What is this medicine? PEGFILGRASTIM (PEG fil gra stim) is a long-acting granulocyte colony-stimulating factor that stimulates the growth of neutrophils, a type of white blood cell important in the body's fight against infection. It is used to reduce the incidence of fever and infection in patients with certain types of cancer who are receiving chemotherapy that affects the bone marrow, and to increase survival after being exposed to high doses of radiation. This medicine may be used for other purposes; ask your health care provider or pharmacist if you have questions. COMMON BRAND NAME(S): Neulasta What should I tell my health care provider before I take this medicine? They need to know if you have any of these conditions: -kidney disease -latex allergy -ongoing radiation therapy -sickle cell disease -skin reactions to acrylic adhesives (On-Body Injector only) -an unusual or allergic reaction to pegfilgrastim, filgrastim, other medicines, foods, dyes, or preservatives -pregnant or trying to get pregnant -breast-feeding How should I use this medicine? This medicine is for injection under the skin. If you get this medicine at home, you will be taught how to prepare and give the pre-filled syringe or how to use the On-body Injector. Refer to the patient Instructions for Use for detailed instructions. Use exactly as directed. Tell your healthcare provider immediately if you suspect that the On-body Injector may not have performed as intended or if you suspect the use of the On-body Injector resulted in a missed or partial dose. It is important that you put your used needles and syringes in a special sharps container. Do not put them in a trash can. If you do not have a sharps container, call your pharmacist or healthcare provider to get one. Talk to your pediatrician regarding the use of this medicine in children. While this drug may be prescribed for selected conditions,  precautions do apply. Overdosage: If you think you have taken too much of this medicine contact a poison control center or emergency room at once. NOTE: This medicine is only for you. Do not share this medicine with others. What if I miss a dose? It is important not to miss your dose. Call your doctor or health care professional if you miss your dose. If you miss a dose due to an On-body Injector failure or leakage, a new dose should be administered as soon as possible using a single prefilled syringe for manual use. What may interact with this medicine? Interactions have not been studied. Give your health care provider a list of all the medicines, herbs, non-prescription drugs, or dietary supplements you use. Also tell them if you smoke, drink alcohol, or use illegal drugs. Some items may interact with your medicine. This list may not describe all possible interactions. Give your health care provider a list of all the medicines, herbs, non-prescription drugs, or dietary supplements you use. Also tell them if you smoke, drink alcohol, or use illegal drugs. Some items may interact with your medicine. What should I watch for while using this medicine? You may need blood work done while you are taking this medicine. If you are going to need a MRI, CT scan, or other procedure, tell your doctor that you are using this medicine (On-Body Injector only). What side effects may I notice from receiving this medicine? Side effects that you should report to your doctor or health care professional as soon as possible: -allergic reactions like skin rash, itching or hives, swelling of the face, lips, or tongue -dizziness -fever -pain, redness, or irritation at site   where injected -pinpoint red spots on the skin -red or dark-brown urine -shortness of breath or breathing problems -stomach or side pain, or pain at the shoulder -swelling -tiredness -trouble passing urine or change in the amount of urine Side  effects that usually do not require medical attention (report to your doctor or health care professional if they continue or are bothersome): -bone pain -muscle pain This list may not describe all possible side effects. Call your doctor for medical advice about side effects. You may report side effects to FDA at 1-800-FDA-1088. Where should I keep my medicine? Keep out of the reach of children. Store pre-filled syringes in a refrigerator between 2 and 8 degrees C (36 and 46 degrees F). Do not freeze. Keep in carton to protect from light. Throw away this medicine if it is left out of the refrigerator for more than 48 hours. Throw away any unused medicine after the expiration date. NOTE: This sheet is a summary. It may not cover all possible information. If you have questions about this medicine, talk to your doctor, pharmacist, or health care provider.  2018 Elsevier/Gold Standard (2016-01-17 12:58:03)  

## 2016-11-28 ENCOUNTER — Encounter: Payer: Self-pay | Admitting: Hematology

## 2016-12-08 ENCOUNTER — Telehealth: Payer: Self-pay | Admitting: Hematology

## 2016-12-08 NOTE — Telephone Encounter (Signed)
12/08/2016 Faxed completed FMLA to [employer] @ [phone]. Spoke with [name] @ 11:41 am @ [phone] to inform her that her forms were completed and faxed. She requested a copy to be left with front desk receptionist, Ms. Wilma, and she will pick them up tomorrow at her appointment she has scheduled.

## 2016-12-09 ENCOUNTER — Ambulatory Visit (HOSPITAL_BASED_OUTPATIENT_CLINIC_OR_DEPARTMENT_OTHER): Payer: 59

## 2016-12-09 ENCOUNTER — Ambulatory Visit (HOSPITAL_BASED_OUTPATIENT_CLINIC_OR_DEPARTMENT_OTHER): Payer: 59 | Admitting: Hematology

## 2016-12-09 ENCOUNTER — Other Ambulatory Visit (HOSPITAL_BASED_OUTPATIENT_CLINIC_OR_DEPARTMENT_OTHER): Payer: 59

## 2016-12-09 ENCOUNTER — Encounter: Payer: Self-pay | Admitting: Hematology

## 2016-12-09 ENCOUNTER — Ambulatory Visit: Payer: 59

## 2016-12-09 VITALS — BP 120/66 | HR 95 | Temp 98.1°F | Resp 20 | Ht 69.0 in | Wt 203.4 lb

## 2016-12-09 DIAGNOSIS — G4700 Insomnia, unspecified: Secondary | ICD-10-CM | POA: Diagnosis not present

## 2016-12-09 DIAGNOSIS — N951 Menopausal and female climacteric states: Secondary | ICD-10-CM

## 2016-12-09 DIAGNOSIS — C8148 Lymphocyte-rich classical Hodgkin lymphoma, lymph nodes of multiple sites: Secondary | ICD-10-CM

## 2016-12-09 DIAGNOSIS — R5383 Other fatigue: Secondary | ICD-10-CM

## 2016-12-09 DIAGNOSIS — R232 Flushing: Secondary | ICD-10-CM

## 2016-12-09 DIAGNOSIS — Z5111 Encounter for antineoplastic chemotherapy: Secondary | ICD-10-CM

## 2016-12-09 DIAGNOSIS — G620 Drug-induced polyneuropathy: Secondary | ICD-10-CM | POA: Diagnosis not present

## 2016-12-09 DIAGNOSIS — T451X5A Adverse effect of antineoplastic and immunosuppressive drugs, initial encounter: Secondary | ICD-10-CM

## 2016-12-09 DIAGNOSIS — R454 Irritability and anger: Secondary | ICD-10-CM | POA: Diagnosis not present

## 2016-12-09 DIAGNOSIS — R61 Generalized hyperhidrosis: Secondary | ICD-10-CM

## 2016-12-09 DIAGNOSIS — Z95828 Presence of other vascular implants and grafts: Secondary | ICD-10-CM

## 2016-12-09 DIAGNOSIS — C8110 Nodular sclerosis classical Hodgkin lymphoma, unspecified site: Secondary | ICD-10-CM

## 2016-12-09 DIAGNOSIS — D6481 Anemia due to antineoplastic chemotherapy: Secondary | ICD-10-CM

## 2016-12-09 LAB — COMPREHENSIVE METABOLIC PANEL
ALBUMIN: 3.7 g/dL (ref 3.5–5.0)
ALT: 27 U/L (ref 0–55)
ANION GAP: 10 meq/L (ref 3–11)
AST: 16 U/L (ref 5–34)
Alkaline Phosphatase: 89 U/L (ref 40–150)
BILIRUBIN TOTAL: 0.27 mg/dL (ref 0.20–1.20)
BUN: 6.6 mg/dL — ABNORMAL LOW (ref 7.0–26.0)
CALCIUM: 9 mg/dL (ref 8.4–10.4)
CO2: 24 mEq/L (ref 22–29)
CREATININE: 1 mg/dL (ref 0.6–1.1)
Chloride: 107 mEq/L (ref 98–109)
EGFR: >60 mL/min/{1.73_m2} (ref >60)
Glucose: 125 mg/dl (ref 70–140)
Potassium: 3.6 mEq/L (ref 3.5–5.1)
Sodium: 140 mEq/L (ref 136–145)
TOTAL PROTEIN: 6.4 g/dL (ref 6.4–8.3)

## 2016-12-09 LAB — CBC & DIFF AND RETIC
BASO%: 0.3 % (ref 0.0–2.0)
Basophils Absolute: 0 10*3/uL (ref 0.0–0.1)
EOS%: 0.2 % (ref 0.0–7.0)
Eosinophils Absolute: 0 10*3/uL (ref 0.0–0.5)
HEMATOCRIT: 32.2 % — AB (ref 34.8–46.6)
HGB: 10 g/dL — ABNORMAL LOW (ref 11.6–15.9)
Immature Retic Fract: 22.7 % — ABNORMAL HIGH (ref 1.60–10.00)
LYMPH%: 16.8 % (ref 14.0–49.7)
MCH: 32.4 pg (ref 25.1–34.0)
MCHC: 31.1 g/dL — AB (ref 31.5–36.0)
MCV: 104.2 fL — AB (ref 79.5–101.0)
MONO#: 0.6 10*3/uL (ref 0.1–0.9)
MONO%: 5.3 % (ref 0.0–14.0)
NEUT%: 77.4 % — ABNORMAL HIGH (ref 38.4–76.8)
NEUTROS ABS: 9.2 10*3/uL — AB (ref 1.5–6.5)
Platelets: 198 10*3/uL (ref 145–400)
RBC: 3.09 10*6/uL — ABNORMAL LOW (ref 3.70–5.45)
RDW: 17.7 % — AB (ref 11.2–14.5)
RETIC %: 7.69 % — AB (ref 0.70–2.10)
Retic Ct Abs: 237.62 10*3/uL — ABNORMAL HIGH (ref 33.70–90.70)
WBC: 11.8 10*3/uL — AB (ref 3.9–10.3)
lymph#: 2 10*3/uL (ref 0.9–3.3)

## 2016-12-09 MED ORDER — HEPARIN SOD (PORK) LOCK FLUSH 100 UNIT/ML IV SOLN
500.0000 [IU] | Freq: Once | INTRAVENOUS | Status: AC | PRN
  Administered 2016-12-09: 500 [IU]
  Filled 2016-12-09: qty 5

## 2016-12-09 MED ORDER — PALONOSETRON HCL INJECTION 0.25 MG/5ML
INTRAVENOUS | Status: AC
  Filled 2016-12-09: qty 5

## 2016-12-09 MED ORDER — SODIUM CHLORIDE 0.9 % IV SOLN
Freq: Once | INTRAVENOUS | Status: DC
  Filled 2016-12-09: qty 5

## 2016-12-09 MED ORDER — LORAZEPAM 0.5 MG PO TABS
0.5000 mg | ORAL_TABLET | Freq: Three times a day (TID) | ORAL | 0 refills | Status: DC
Start: 2016-12-09 — End: 2017-05-13

## 2016-12-09 MED ORDER — DOXORUBICIN HCL CHEMO IV INJECTION 2 MG/ML
25.0000 mg/m2 | Freq: Once | INTRAVENOUS | Status: AC
  Administered 2016-12-09: 54 mg via INTRAVENOUS
  Filled 2016-12-09: qty 27

## 2016-12-09 MED ORDER — VINBLASTINE SULFATE CHEMO INJECTION 1 MG/ML
6.0500 mg/m2 | Freq: Once | INTRAVENOUS | Status: AC
  Administered 2016-12-09: 13 mg via INTRAVENOUS
  Filled 2016-12-09: qty 13

## 2016-12-09 MED ORDER — SODIUM CHLORIDE 0.9 % IV SOLN
375.0000 mg/m2 | Freq: Once | INTRAVENOUS | Status: AC
  Administered 2016-12-09: 800 mg via INTRAVENOUS
  Filled 2016-12-09: qty 40

## 2016-12-09 MED ORDER — DULOXETINE HCL 30 MG PO CPEP: 30.0000 mg | ORAL_CAPSULE | Freq: Every day | ORAL | 2 refills | Status: DC

## 2016-12-09 MED ORDER — SODIUM CHLORIDE 0.9% FLUSH
10.0000 mL | INTRAVENOUS | Status: DC | PRN
  Administered 2016-12-09: 10 mL
  Filled 2016-12-09: qty 10

## 2016-12-09 MED ORDER — SODIUM CHLORIDE 0.9% FLUSH
10.0000 mL | Freq: Once | INTRAVENOUS | Status: AC
  Administered 2016-12-09: 10 mL
  Filled 2016-12-09: qty 10

## 2016-12-09 MED ORDER — SODIUM CHLORIDE 0.9 % IV SOLN
Freq: Once | INTRAVENOUS | Status: AC
  Administered 2016-12-09: 10:00:00 via INTRAVENOUS

## 2016-12-09 MED ORDER — PALONOSETRON HCL INJECTION 0.25 MG/5ML
0.2500 mg | Freq: Once | INTRAVENOUS | Status: AC
  Administered 2016-12-09: 0.25 mg via INTRAVENOUS

## 2016-12-09 MED ORDER — SODIUM CHLORIDE 0.9 % IV SOLN
Freq: Once | INTRAVENOUS | Status: AC
  Administered 2016-12-09: 11:00:00 via INTRAVENOUS
  Filled 2016-12-09: qty 5

## 2016-12-09 NOTE — Progress Notes (Signed)
Marland Kitchen    HEMATOLOGY/ONCOLOGY CLINIC NOTE  Date of Service: 12/09/2016   Patient Care Team: Donald Prose, MD as PCP - General (Family Medicine)  CHIEF COMPLAINTS/PURPOSE OF CONSULTATION:  F/u for management of Hodgkins lymphoma  HISTORY OF PRESENTING ILLNESS:   Rhonda Mccann is a wonderful 46 y.o. female who has been referred to [place] by Dr .Donald Prose, MD for evaluation and management of newly diagnosed Hodgkins Lymphoma.  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  Patient first reported noticing a lump in the left side of her neck in January 2018. She noted some mild progressive increase in this lump which appeared more prominent and she reported it to her primary care physician. Patient had an ultrasound of her neck on 07/07/2016 which showed a 1.9 x 0.8 x 1.4 cm lymph node with no distinct fatty hilum. This was subsequently biopsied under ultrasound guidance on 07/21/2016 and findings are consistent with classical Hodgkin's lymphoma nodular sclerosis subtype.  Patient notes no other overtly palpable lymphadenopathy noted. No fevers no chills no night sweats no unexpected weight loss. She reports no lethargy no anorexia. Overall feels quite well.  INTERVAL HISTORY  Pt presents for follow-up accompanied by her husband prior to C5D1. Since last being seen, she has overall been doing well. She does reports that she is concerned she may be peri-menopausal. She has not had a menses since beginning her treatment, and she also notes some mood irritability, hot flashes, and night sweats with this as well. Additionally, she reports that the distal portion of her plantar feet have remained numb throughout her treatment, however, she has now developed occasional shooting pains over the area as well. Blood counts have remained stable.   On review of systems, pt denies fever,  chills, rash, mouth sores, weight loss, urinary complaints. Denies pain. Pt denies abdominal pain, nausea, vomiting. She notes ongoing neuropathic changes to her plantar feet, night sweats, hot flashes, and occasional mood irritability.   MEDICAL HISTORY:  Past Medical History:  Diagnosis Date  . Depression   . Drug abuse 2001  EX alcohol abuse sober for 17 years  Donated left kidney in 2013 (single kidney) Scoliosis GERD  SURGICAL HISTORY: Past Surgical History:  Procedure Laterality Date  . IR FLUORO GUIDE PORT INSERTION RIGHT  08/05/2016  . IR US GUIDE VASC ACCESS RIGHT  08/05/2016  . NEPHRECTOMY Left 2015  . NEPHRECTOMY LIVING DONOR  2013  Breast Augmentation Surgery in 1994 (saline implants)  SOCIAL HISTORY: Social History   Socioeconomic History  . Marital status: Married    Spouse name: Not on file  . Number of children: Not on file  . Years of education: Not on file  . Highest education level: Not on file  Social Needs  . Financial resource strain: Not on file  . Food insecurity - worry: Not on file  . Food insecurity - inability: Not on file  . Transportation needs - medical: Not on file  . Transportation needs - non-medical: Not on file  Occupational History  . Not on file  Tobacco Use  . Smoking status: Former Smoker    Types: Cigarettes    Last attempt to quit: 1998    Years since quitting: 20.8  . Smokeless tobacco: Never Used  . Tobacco comment: quit 17 yrs ago  Substance and Sexual Activity  . Alcohol use: No  . Drug  use: No    Comment: Previous drug user - quite 2001  . Sexual activity: Yes  Other Topics Concern  . Not on file  Social History Narrative  . Not on file  Works as a Production manager   FAMILY HISTORY: Paternal aunt had breast cancer Paternal grandfather had lymphoma  ALLERGIES:  is allergic to contrast media [iodinated diagnostic agents] and nsaids.  MEDICATIONS:  Current Outpatient Medications  Medication Sig Dispense Refill  .  buPROPion (WELLBUTRIN XL) 300 MG 24 hr tablet Take 1 tablet by mouth daily.    Marland Kitchen dexamethasone (DECADRON) 4 MG tablet Take 2 tablets (8 mg total) by mouth See admin instructions. 8mg  po with breakfast and lunch for 2 days after each chemotherapy 30 tablet 2  . HYDROcodone-acetaminophen (NORCO) 5-325 MG tablet Take 1-2 tablets by mouth every 6 (six) hours as needed for moderate pain. 30 tablet 0  . lidocaine-prilocaine (EMLA) cream Apply 1 application topically as needed. 30 g 0  . LORazepam (ATIVAN) 0.5 MG tablet Take 1 tablet (0.5 mg total) by mouth every 8 (eight) hours. 30 tablet 0  . magic mouthwash w/lidocaine SOLN Take 5 mLs by mouth 4 (four) times daily as needed for mouth pain. 1 part viscous lidocaine 2% 1 part maalox 1 part nystatin 500,000 IU per 27ml 120 mL 0  . omeprazole (PRILOSEC) 20 MG capsule Take 20 mg by mouth daily.    . ondansetron (ZOFRAN) 8 MG tablet Take 1 tablet (8 mg total) by mouth every 8 (eight) hours as needed for nausea or vomiting. 30 tablet 1  . prochlorperazine (COMPAZINE) 10 MG tablet Take 1 tablet (10 mg total) by mouth every 6 (six) hours as needed for nausea or vomiting. 30 tablet 0  . traZODone (DESYREL) 50 MG tablet Take 1 tablet (50 mg total) by mouth at bedtime as needed for sleep. 30 tablet 0   No current facility-administered medications for this visit.     REVIEW OF SYSTEMS:    10 Point review of Systems was done is negative except as noted above.  PHYSICAL EXAMINATION: ECOG PERFORMANCE STATUS: 0 - Asymptomatic  .VS as per EPIC GENERAL:alert, in no acute distress and comfortable SKIN: no acute rashes, no significant lesions EYES: conjunctiva are pink and non-injected, sclera anicteric OROPHARYNX: MMM, no exudates, no oropharyngeal erythema or ulceration NECK: supple, no JVD LYMPH: no palpable lymphadenopathy in the cervical,axillary or inguinal regions. Previous small cervical LN not palpable at this time. LUNGS: clear to auscultation b/l with  normal respiratory effort HEART: regular rate & rhythm ABDOMEN:  normoactive bowel sounds , non tender, not distended. Extremity: no pedal edema PSYCH: alert & oriented x 3 with fluent speech NEURO: no focal motor/sensory deficits  LABORATORY DATA:  I have reviewed the data as listed  . CBC Latest Ref Rng & Units 12/09/2016 11/25/2016 11/11/2016  WBC 3.9 - 10.3 10e3/uL 11.8(H) 11.6(H) 12.6(H)  Hemoglobin 11.6 - 15.9 g/dL 10.0(L) 9.9(L) 9.6(L)  Hematocrit 34.8 - 46.6 % 32.2(L) 31.8(L) 30.7(L)  Platelets 145 - 400 10e3/uL 198 186 176   ANC 700 . CMP Latest Ref Rng & Units 12/09/2016 11/25/2016 11/11/2016  Glucose 70 - 140 mg/dl 125 105 94  BUN 7.0 - 26.0 mg/dL 6.6(L) 5.5(L) 6.9(L)  Creatinine 0.6 - 1.1 mg/dL 1.0 1.0 1.0  Sodium 136 - 145 mEq/L 140 141 139  Potassium 3.5 - 5.1 mEq/L 3.6 3.6 4.0  CO2 22 - 29 mEq/L 24 23 25   Calcium 8.4 - 10.4 mg/dL 9.0 9.2  9.0  Total Protein 6.4 - 8.3 g/dL 6.4 6.3(L) 6.4  Total Bilirubin 0.20 - 1.20 mg/dL 0.27 0.31 0.29  Alkaline Phos 40 - 150 U/L 89 83 91  AST 5 - 34 U/L 16 16 11   ALT 0 - 55 U/L 27 22 17          RADIOGRAPHIC STUDIES: I have personally reviewed the radiological images as listed and agreed with the findings in the report. No results found.  ASSESSMENT & PLAN:   46 yo very pleasant caucasian female with   1) Stage IIA (adverse risk due to >3 regions of involvement, non bulky) Classical Hodgkins Lymphoma -Nodular Sclerosis Subtype No constitutional symptoms. Sed rate 18 (WNL) PET/CT reviewed ECHO EF 60-65% DLCO uncorrected 70% of predicted. 24h creatinine clearance WNL.  Patient is s/p 4 cycles of rx -PET from 10/07/2016 reviewed. Near complete metabolic response to therapy and no residual disease > [place] 2  2) Grade 1 -nausea -resolved -patient has prn zofran and compazine.  3) Grade 1 mucositis - resolved -continue the use of oral cryotherapy during chemotherapy -continue salt/baking soda mouthwashes  4)  Neutropenia from chemotherapy. Now resolved with the use of neulasta. 5) Neulasta related bone pains -controlled with prn pain medications. Plan -continue Neulasta D3 and D17 every cycle -vicodin prn for bone pains. -daily loratadine for 5-7 days with each cycle to reduce Neulasta-related bone pains.  6) Insomnia - she has trouble sleeping some nights trazodone prn   7) Grade 1 neuropathy from ctx. Will monitor on treatment. Plan -Labs stable. No prohibitive toxicities-patient appropriate to proceed with C5D1 of treatment (AVD) today, no bleomycin for the remaining cycles -no prohibitive toxicities at this time that require overt treatment changes -continue neulasta support -We will begin her on 30mg  Cymbalta as well. She is currently on Wellbutrin and I have informed her to discontinue this medication since she has been started on Cymbalta (for her hot flashes/neuropathy and depression). -will plan to increase Cymbalta to 60mg  po daily in 2 weeks if tolerated. -I will refer her to cancer rehab as well to address fatigue.  8) Single kidney - patient donated left kidney in 2013 -avoid nephrotoxic medications -Creatinine stable  9) Perimenopausal status -The patient reports that she believes that she is perimenopausal, she has not had a menses since beginning her treatment. She also expresses that she has been having mood irritability, hot flashes, and night sweats.  -I did offer her Cymbalta, she is interested and I will begin her on this at 30mg . In the interim I encouraged her to remain well hydrated, reduce caffeine intake, and remaining active. I also informed her that she can try taking Vitamin E to help with hot flashes and try drinking hot tea and taking massages as well.    Continue chemotherapy as per orders.   Please schedule C5D15, C6D1, and [id number].  Labs q2weeks RTC with Dr Irene Limbo in 2 weeks with labs   All of the patients questions were answered with apparent satisfaction.  The patient knows to call the clinic with any problems, questions or concerns.  I spent 20 minutes counseling the patient face to face. The total time spent in the appointment was 25 minutes and more than 50% was on counseling and direct patient cares.  .I have reviewed the above documentation for accuracy and completeness, and I agree with the above.  Sullivan Lone MD Scissors AAHIVMS [facility] Hematology/Oncology Physician [place]  (Office):  [phone] (Work cell):  [phone] (Fax):           [phone]  This document serves as a record of services personally performed by Sullivan Lone, MD. It was created on his behalf by Reola Mosher, a trained medical scribe. The creation of this record is based on the scribe's personal observations and the provider's statements to them. This document has been checked and approved by the attending provider.

## 2016-12-09 NOTE — Patient Instructions (Signed)
[facility] Discharge Instructions for Patients Receiving Chemotherapy  Today you received the following chemotherapy agents:  Adriamycin, Vinblastine, Dacarbazine  To help prevent nausea and vomiting after your treatment, we encourage you to take your nausea medication as prescribed.   If you develop nausea and vomiting that is not controlled by your nausea medication, call the clinic.   BELOW ARE SYMPTOMS THAT SHOULD BE REPORTED IMMEDIATELY:  *FEVER GREATER THAN 100.5 F  *CHILLS WITH OR WITHOUT FEVER  NAUSEA AND VOMITING THAT IS NOT CONTROLLED WITH YOUR NAUSEA MEDICATION  *UNUSUAL SHORTNESS OF BREATH  *UNUSUAL BRUISING OR BLEEDING  TENDERNESS IN MOUTH AND THROAT WITH OR WITHOUT PRESENCE OF ULCERS  *URINARY PROBLEMS  *BOWEL PROBLEMS  UNUSUAL RASH Items with * indicate a potential emergency and should be followed up as soon as possible.  Feel free to call the clinic should you have any questions or concerns. The clinic phone number is [phone].  Please show the CHEMO ALERT CARD at check-in to the Emergency Department and triage nurse.   

## 2016-12-11 ENCOUNTER — Telehealth: Payer: Self-pay

## 2016-12-11 ENCOUNTER — Ambulatory Visit (HOSPITAL_BASED_OUTPATIENT_CLINIC_OR_DEPARTMENT_OTHER): Payer: 59

## 2016-12-11 VITALS — BP 102/62 | HR 86 | Temp 97.3°F | Resp 18

## 2016-12-11 DIAGNOSIS — Z5189 Encounter for other specified aftercare: Secondary | ICD-10-CM

## 2016-12-11 DIAGNOSIS — C8148 Lymphocyte-rich classical Hodgkin lymphoma, lymph nodes of multiple sites: Secondary | ICD-10-CM | POA: Diagnosis not present

## 2016-12-11 MED ORDER — PEGFILGRASTIM INJECTION 6 MG/0.6ML ~~LOC~~
6.0000 mg | PREFILLED_SYRINGE | Freq: Once | SUBCUTANEOUS | Status: AC
  Administered 2016-12-11: 6 mg via SUBCUTANEOUS
  Filled 2016-12-11: qty 0.6

## 2016-12-11 NOTE — Telephone Encounter (Signed)
Confirmed with Network engineer at [facility] that referral from Dr. Irene Limbo was received and the patient has been scheduled for an initial visit on 11/16. Additional call made to Central Scheduling. Pt is in the que for PET scan to be scheduled prior to 12/27.

## 2016-12-11 NOTE — Patient Instructions (Signed)
Pegfilgrastim injection What is this medicine? PEGFILGRASTIM (PEG fil gra stim) is a long-acting granulocyte colony-stimulating factor that stimulates the growth of neutrophils, a type of white blood cell important in the body's fight against infection. It is used to reduce the incidence of fever and infection in patients with certain types of cancer who are receiving chemotherapy that affects the bone marrow, and to increase survival after being exposed to high doses of radiation. This medicine may be used for other purposes; ask your health care provider or pharmacist if you have questions. COMMON BRAND NAME(S): Neulasta What should I tell my health care provider before I take this medicine? They need to know if you have any of these conditions: -kidney disease -latex allergy -ongoing radiation therapy -sickle cell disease -skin reactions to acrylic adhesives (On-Body Injector only) -an unusual or allergic reaction to pegfilgrastim, filgrastim, other medicines, foods, dyes, or preservatives -pregnant or trying to get pregnant -breast-feeding How should I use this medicine? This medicine is for injection under the skin. If you get this medicine at home, you will be taught how to prepare and give the pre-filled syringe or how to use the On-body Injector. Refer to the patient Instructions for Use for detailed instructions. Use exactly as directed. Tell your healthcare provider immediately if you suspect that the On-body Injector may not have performed as intended or if you suspect the use of the On-body Injector resulted in a missed or partial dose. It is important that you put your used needles and syringes in a special sharps container. Do not put them in a trash can. If you do not have a sharps container, call your pharmacist or healthcare provider to get one. Talk to your pediatrician regarding the use of this medicine in children. While this drug may be prescribed for selected conditions,  precautions do apply. Overdosage: If you think you have taken too much of this medicine contact a poison control center or emergency room at once. NOTE: This medicine is only for you. Do not share this medicine with others. What if I miss a dose? It is important not to miss your dose. Call your doctor or health care professional if you miss your dose. If you miss a dose due to an On-body Injector failure or leakage, a new dose should be administered as soon as possible using a single prefilled syringe for manual use. What may interact with this medicine? Interactions have not been studied. Give your health care provider a list of all the medicines, herbs, non-prescription drugs, or dietary supplements you use. Also tell them if you smoke, drink alcohol, or use illegal drugs. Some items may interact with your medicine. This list may not describe all possible interactions. Give your health care provider a list of all the medicines, herbs, non-prescription drugs, or dietary supplements you use. Also tell them if you smoke, drink alcohol, or use illegal drugs. Some items may interact with your medicine. What should I watch for while using this medicine? You may need blood work done while you are taking this medicine. If you are going to need a MRI, CT scan, or other procedure, tell your doctor that you are using this medicine (On-Body Injector only). What side effects may I notice from receiving this medicine? Side effects that you should report to your doctor or health care professional as soon as possible: -allergic reactions like skin rash, itching or hives, swelling of the face, lips, or tongue -dizziness -fever -pain, redness, or irritation at site   where injected -pinpoint red spots on the skin -red or dark-brown urine -shortness of breath or breathing problems -stomach or side pain, or pain at the shoulder -swelling -tiredness -trouble passing urine or change in the amount of urine Side  effects that usually do not require medical attention (report to your doctor or health care professional if they continue or are bothersome): -bone pain -muscle pain This list may not describe all possible side effects. Call your doctor for medical advice about side effects. You may report side effects to FDA at 1-800-FDA-1088. Where should I keep my medicine? Keep out of the reach of children. Store pre-filled syringes in a refrigerator between 2 and 8 degrees C (36 and 46 degrees F). Do not freeze. Keep in carton to protect from light. Throw away this medicine if it is left out of the refrigerator for more than 48 hours. Throw away any unused medicine after the expiration date. NOTE: This sheet is a summary. It may not cover all possible information. If you have questions about this medicine, talk to your doctor, pharmacist, or health care provider.  2018 Elsevier/Gold Standard (2016-01-17 12:58:03)  

## 2016-12-17 ENCOUNTER — Other Ambulatory Visit: Payer: Self-pay | Admitting: *Deleted

## 2016-12-17 MED ORDER — TRAZODONE HCL 50 MG PO TABS: 50.0000 mg | ORAL_TABLET | Freq: Every evening | ORAL | 0 refills | Status: DC | PRN

## 2016-12-19 ENCOUNTER — Ambulatory Visit: Payer: 59 | Attending: Physical Therapy | Admitting: Physical Therapy

## 2016-12-23 ENCOUNTER — Ambulatory Visit: Payer: 59

## 2016-12-23 ENCOUNTER — Other Ambulatory Visit (HOSPITAL_BASED_OUTPATIENT_CLINIC_OR_DEPARTMENT_OTHER): Payer: 59

## 2016-12-23 ENCOUNTER — Ambulatory Visit (HOSPITAL_BASED_OUTPATIENT_CLINIC_OR_DEPARTMENT_OTHER): Payer: 59

## 2016-12-23 VITALS — BP 121/72 | HR 84 | Temp 98.1°F | Resp 18 | Wt 202.0 lb

## 2016-12-23 DIAGNOSIS — Z5111 Encounter for antineoplastic chemotherapy: Secondary | ICD-10-CM

## 2016-12-23 DIAGNOSIS — Z95828 Presence of other vascular implants and grafts: Secondary | ICD-10-CM

## 2016-12-23 DIAGNOSIS — C8148 Lymphocyte-rich classical Hodgkin lymphoma, lymph nodes of multiple sites: Secondary | ICD-10-CM

## 2016-12-23 LAB — CBC & DIFF AND RETIC
BASO%: 0.4 % (ref 0.0–2.0)
Basophils Absolute: 0 10*3/uL (ref 0.0–0.1)
EOS ABS: 0 10*3/uL (ref 0.0–0.5)
EOS%: 0.4 % (ref 0.0–7.0)
HCT: 34.3 % — ABNORMAL LOW (ref 34.8–46.6)
HEMOGLOBIN: 10.8 g/dL — AB (ref 11.6–15.9)
Immature Retic Fract: 23.6 % — ABNORMAL HIGH (ref 1.60–10.00)
LYMPH%: 21.7 % (ref 14.0–49.7)
MCH: 32.6 pg (ref 25.1–34.0)
MCHC: 31.5 g/dL (ref 31.5–36.0)
MCV: 103.6 fL — AB (ref 79.5–101.0)
MONO#: 0.4 10*3/uL (ref 0.1–0.9)
MONO%: 5.1 % (ref 0.0–14.0)
NEUT%: 72.4 % (ref 38.4–76.8)
NEUTROS ABS: 5.3 10*3/uL (ref 1.5–6.5)
Platelets: 230 10*3/uL (ref 145–400)
RBC: 3.31 10*6/uL — ABNORMAL LOW (ref 3.70–5.45)
RDW: 16.4 % — AB (ref 11.2–14.5)
Retic %: 8.03 % — ABNORMAL HIGH (ref 0.70–2.10)
Retic Ct Abs: 265.79 10*3/uL — ABNORMAL HIGH (ref 33.70–90.70)
WBC: 7.4 10*3/uL (ref 3.9–10.3)
lymph#: 1.6 10*3/uL (ref 0.9–3.3)

## 2016-12-23 LAB — COMPREHENSIVE METABOLIC PANEL
ALBUMIN: 3.6 g/dL (ref 3.5–5.0)
ALK PHOS: 93 U/L (ref 40–150)
ALT: 35 U/L (ref 0–55)
ANION GAP: 12 meq/L — AB (ref 3–11)
AST: 20 U/L (ref 5–34)
BILIRUBIN TOTAL: 0.22 mg/dL (ref 0.20–1.20)
BUN: 6 mg/dL — AB (ref 7.0–26.0)
CALCIUM: 9.1 mg/dL (ref 8.4–10.4)
CO2: 23 mEq/L (ref 22–29)
CREATININE: 0.9 mg/dL (ref 0.6–1.1)
Chloride: 105 mEq/L (ref 98–109)
EGFR: >60 mL/min/{1.73_m2} (ref >60)
Glucose: 119 mg/dl (ref 70–140)
Potassium: 3.6 mEq/L (ref 3.5–5.1)
Sodium: 140 mEq/L (ref 136–145)
TOTAL PROTEIN: 6.5 g/dL (ref 6.4–8.3)

## 2016-12-23 MED ORDER — PALONOSETRON HCL INJECTION 0.25 MG/5ML
INTRAVENOUS | Status: AC
  Filled 2016-12-23: qty 5

## 2016-12-23 MED ORDER — FOSAPREPITANT DIMEGLUMINE INJECTION 150 MG
Freq: Once | INTRAVENOUS | Status: AC
  Administered 2016-12-23: 11:00:00 via INTRAVENOUS
  Filled 2016-12-23: qty 5

## 2016-12-23 MED ORDER — PALONOSETRON HCL INJECTION 0.25 MG/5ML
0.2500 mg | Freq: Once | INTRAVENOUS | Status: AC
  Administered 2016-12-23: 0.25 mg via INTRAVENOUS

## 2016-12-23 MED ORDER — SODIUM CHLORIDE 0.9% FLUSH
10.0000 mL | INTRAVENOUS | Status: DC | PRN
  Administered 2016-12-23: 10 mL
  Filled 2016-12-23: qty 10

## 2016-12-23 MED ORDER — VINBLASTINE SULFATE CHEMO INJECTION 1 MG/ML
6.0500 mg/m2 | Freq: Once | INTRAVENOUS | Status: AC
  Administered 2016-12-23: 13 mg via INTRAVENOUS
  Filled 2016-12-23: qty 13

## 2016-12-23 MED ORDER — SODIUM CHLORIDE 0.9 % IV SOLN
375.0000 mg/m2 | Freq: Once | INTRAVENOUS | Status: AC
  Administered 2016-12-23: 800 mg via INTRAVENOUS
  Filled 2016-12-23: qty 40

## 2016-12-23 MED ORDER — SODIUM CHLORIDE 0.9% FLUSH
10.0000 mL | Freq: Once | INTRAVENOUS | Status: AC
  Administered 2016-12-23: 10 mL
  Filled 2016-12-23: qty 10

## 2016-12-23 MED ORDER — DOXORUBICIN HCL CHEMO IV INJECTION 2 MG/ML
25.0000 mg/m2 | Freq: Once | INTRAVENOUS | Status: AC
  Administered 2016-12-23: 54 mg via INTRAVENOUS
  Filled 2016-12-23: qty 27

## 2016-12-23 MED ORDER — HEPARIN SOD (PORK) LOCK FLUSH 100 UNIT/ML IV SOLN
500.0000 [IU] | Freq: Once | INTRAVENOUS | Status: AC | PRN
  Administered 2016-12-23: 500 [IU]
  Filled 2016-12-23: qty 5

## 2016-12-23 MED ORDER — SODIUM CHLORIDE 0.9 % IV SOLN
Freq: Once | INTRAVENOUS | Status: AC
  Administered 2016-12-23: 11:00:00 via INTRAVENOUS

## 2016-12-23 NOTE — Patient Instructions (Signed)
[facility] Discharge Instructions for Patients Receiving Chemotherapy  Today you received the following chemotherapy agents:  Adriamycin, Vinblastine, Dacarbazine  To help prevent nausea and vomiting after your treatment, we encourage you to take your nausea medication as prescribed.   If you develop nausea and vomiting that is not controlled by your nausea medication, call the clinic.   BELOW ARE SYMPTOMS THAT SHOULD BE REPORTED IMMEDIATELY:  *FEVER GREATER THAN 100.5 F  *CHILLS WITH OR WITHOUT FEVER  NAUSEA AND VOMITING THAT IS NOT CONTROLLED WITH YOUR NAUSEA MEDICATION  *UNUSUAL SHORTNESS OF BREATH  *UNUSUAL BRUISING OR BLEEDING  TENDERNESS IN MOUTH AND THROAT WITH OR WITHOUT PRESENCE OF ULCERS  *URINARY PROBLEMS  *BOWEL PROBLEMS  UNUSUAL RASH Items with * indicate a potential emergency and should be followed up as soon as possible.  Feel free to call the clinic should you have any questions or concerns. The clinic phone number is [phone].  Please show the CHEMO ALERT CARD at check-in to the Emergency Department and triage nurse.   

## 2016-12-24 ENCOUNTER — Ambulatory Visit (HOSPITAL_BASED_OUTPATIENT_CLINIC_OR_DEPARTMENT_OTHER): Payer: 59

## 2016-12-24 VITALS — BP 140/77 | HR 84 | Temp 98.1°F | Resp 18

## 2016-12-24 DIAGNOSIS — Z5189 Encounter for other specified aftercare: Secondary | ICD-10-CM | POA: Diagnosis not present

## 2016-12-24 DIAGNOSIS — C8148 Lymphocyte-rich classical Hodgkin lymphoma, lymph nodes of multiple sites: Secondary | ICD-10-CM

## 2016-12-24 MED ORDER — PEGFILGRASTIM INJECTION 6 MG/0.6ML ~~LOC~~
6.0000 mg | PREFILLED_SYRINGE | Freq: Once | SUBCUTANEOUS | Status: AC
  Administered 2016-12-24: 6 mg via SUBCUTANEOUS
  Filled 2016-12-24: qty 0.6

## 2016-12-24 NOTE — Patient Instructions (Signed)
Pegfilgrastim injection What is this medicine? PEGFILGRASTIM (PEG fil gra stim) is a long-acting granulocyte colony-stimulating factor that stimulates the growth of neutrophils, a type of white blood cell important in the body's fight against infection. It is used to reduce the incidence of fever and infection in patients with certain types of cancer who are receiving chemotherapy that affects the bone marrow, and to increase survival after being exposed to high doses of radiation. This medicine may be used for other purposes; ask your health care provider or pharmacist if you have questions. COMMON BRAND NAME(S): Neulasta What should I tell my health care provider before I take this medicine? They need to know if you have any of these conditions: -kidney disease -latex allergy -ongoing radiation therapy -sickle cell disease -skin reactions to acrylic adhesives (On-Body Injector only) -an unusual or allergic reaction to pegfilgrastim, filgrastim, other medicines, foods, dyes, or preservatives -pregnant or trying to get pregnant -breast-feeding How should I use this medicine? This medicine is for injection under the skin. If you get this medicine at home, you will be taught how to prepare and give the pre-filled syringe or how to use the On-body Injector. Refer to the patient Instructions for Use for detailed instructions. Use exactly as directed. Tell your healthcare provider immediately if you suspect that the On-body Injector may not have performed as intended or if you suspect the use of the On-body Injector resulted in a missed or partial dose. It is important that you put your used needles and syringes in a special sharps container. Do not put them in a trash can. If you do not have a sharps container, call your pharmacist or healthcare provider to get one. Talk to your pediatrician regarding the use of this medicine in children. While this drug may be prescribed for selected conditions,  precautions do apply. Overdosage: If you think you have taken too much of this medicine contact a poison control center or emergency room at once. NOTE: This medicine is only for you. Do not share this medicine with others. What if I miss a dose? It is important not to miss your dose. Call your doctor or health care professional if you miss your dose. If you miss a dose due to an On-body Injector failure or leakage, a new dose should be administered as soon as possible using a single prefilled syringe for manual use. What may interact with this medicine? Interactions have not been studied. Give your health care provider a list of all the medicines, herbs, non-prescription drugs, or dietary supplements you use. Also tell them if you smoke, drink alcohol, or use illegal drugs. Some items may interact with your medicine. This list may not describe all possible interactions. Give your health care provider a list of all the medicines, herbs, non-prescription drugs, or dietary supplements you use. Also tell them if you smoke, drink alcohol, or use illegal drugs. Some items may interact with your medicine. What should I watch for while using this medicine? You may need blood work done while you are taking this medicine. If you are going to need a MRI, CT scan, or other procedure, tell your doctor that you are using this medicine (On-Body Injector only). What side effects may I notice from receiving this medicine? Side effects that you should report to your doctor or health care professional as soon as possible: -allergic reactions like skin rash, itching or hives, swelling of the face, lips, or tongue -dizziness -fever -pain, redness, or irritation at site   where injected -pinpoint red spots on the skin -red or dark-brown urine -shortness of breath or breathing problems -stomach or side pain, or pain at the shoulder -swelling -tiredness -trouble passing urine or change in the amount of urine Side  effects that usually do not require medical attention (report to your doctor or health care professional if they continue or are bothersome): -bone pain -muscle pain This list may not describe all possible side effects. Call your doctor for medical advice about side effects. You may report side effects to FDA at 1-800-FDA-1088. Where should I keep my medicine? Keep out of the reach of children. Store pre-filled syringes in a refrigerator between 2 and 8 degrees C (36 and 46 degrees F). Do not freeze. Keep in carton to protect from light. Throw away this medicine if it is left out of the refrigerator for more than 48 hours. Throw away any unused medicine after the expiration date. NOTE: This sheet is a summary. It may not cover all possible information. If you have questions about this medicine, talk to your doctor, pharmacist, or health care provider.  2018 Elsevier/Gold Standard (2016-01-17 12:58:03)  

## 2017-01-06 ENCOUNTER — Ambulatory Visit: Payer: 59 | Admitting: Hematology

## 2017-01-06 ENCOUNTER — Ambulatory Visit: Payer: 59

## 2017-01-06 ENCOUNTER — Other Ambulatory Visit: Payer: 59

## 2017-01-07 ENCOUNTER — Ambulatory Visit: Payer: 59

## 2017-01-07 ENCOUNTER — Encounter: Payer: Self-pay | Admitting: Hematology

## 2017-01-07 ENCOUNTER — Ambulatory Visit (HOSPITAL_BASED_OUTPATIENT_CLINIC_OR_DEPARTMENT_OTHER): Payer: 59

## 2017-01-07 ENCOUNTER — Ambulatory Visit (HOSPITAL_BASED_OUTPATIENT_CLINIC_OR_DEPARTMENT_OTHER): Payer: 59 | Admitting: Hematology

## 2017-01-07 ENCOUNTER — Other Ambulatory Visit (HOSPITAL_BASED_OUTPATIENT_CLINIC_OR_DEPARTMENT_OTHER): Payer: 59

## 2017-01-07 VITALS — BP 117/78 | HR 84 | Temp 98.2°F | Resp 18 | Ht 69.0 in | Wt 204.0 lb

## 2017-01-07 DIAGNOSIS — F329 Major depressive disorder, single episode, unspecified: Secondary | ICD-10-CM

## 2017-01-07 DIAGNOSIS — G620 Drug-induced polyneuropathy: Secondary | ICD-10-CM | POA: Diagnosis not present

## 2017-01-07 DIAGNOSIS — Z905 Acquired absence of kidney: Secondary | ICD-10-CM

## 2017-01-07 DIAGNOSIS — C8148 Lymphocyte-rich classical Hodgkin lymphoma, lymph nodes of multiple sites: Secondary | ICD-10-CM

## 2017-01-07 DIAGNOSIS — C8110 Nodular sclerosis classical Hodgkin lymphoma, unspecified site: Secondary | ICD-10-CM

## 2017-01-07 DIAGNOSIS — N951 Menopausal and female climacteric states: Secondary | ICD-10-CM | POA: Diagnosis not present

## 2017-01-07 DIAGNOSIS — D702 Other drug-induced agranulocytosis: Secondary | ICD-10-CM

## 2017-01-07 DIAGNOSIS — Z5111 Encounter for antineoplastic chemotherapy: Secondary | ICD-10-CM | POA: Diagnosis not present

## 2017-01-07 DIAGNOSIS — R232 Flushing: Secondary | ICD-10-CM

## 2017-01-07 DIAGNOSIS — Z95828 Presence of other vascular implants and grafts: Secondary | ICD-10-CM

## 2017-01-07 LAB — CBC & DIFF AND RETIC
BASO%: 0.2 % (ref 0.0–2.0)
Basophils Absolute: 0 10*3/uL (ref 0.0–0.1)
EOS%: 0.6 % (ref 0.0–7.0)
Eosinophils Absolute: 0.1 10*3/uL (ref 0.0–0.5)
HEMATOCRIT: 34.7 % — AB (ref 34.8–46.6)
HEMOGLOBIN: 10.7 g/dL — AB (ref 11.6–15.9)
IMMATURE RETIC FRACT: 17.5 % — AB (ref 1.60–10.00)
LYMPH#: 1.8 10*3/uL (ref 0.9–3.3)
LYMPH%: 21 % (ref 14.0–49.7)
MCH: 32 pg (ref 25.1–34.0)
MCHC: 30.8 g/dL — ABNORMAL LOW (ref 31.5–36.0)
MCV: 103.9 fL — AB (ref 79.5–101.0)
MONO#: 0.5 10*3/uL (ref 0.1–0.9)
MONO%: 5.8 % (ref 0.0–14.0)
NEUT#: 6.1 10*3/uL (ref 1.5–6.5)
NEUT%: 72.4 % (ref 38.4–76.8)
PLATELETS: 226 10*3/uL (ref 145–400)
RBC: 3.34 10*6/uL — ABNORMAL LOW (ref 3.70–5.45)
RDW: 16.2 % — ABNORMAL HIGH (ref 11.2–14.5)
Retic %: 5.74 % — ABNORMAL HIGH (ref 0.70–2.10)
Retic Ct Abs: 191.72 10*3/uL — ABNORMAL HIGH (ref 33.70–90.70)
WBC: 8.4 10*3/uL (ref 3.9–10.3)

## 2017-01-07 LAB — COMPREHENSIVE METABOLIC PANEL
ALT: 49 U/L (ref 0–55)
ANION GAP: 11 meq/L (ref 3–11)
AST: 29 U/L (ref 5–34)
Albumin: 3.8 g/dL (ref 3.5–5.0)
Alkaline Phosphatase: 91 U/L (ref 40–150)
BUN: 7.2 mg/dL (ref 7.0–26.0)
CHLORIDE: 107 meq/L (ref 98–109)
CO2: 22 meq/L (ref 22–29)
CREATININE: 0.9 mg/dL (ref 0.6–1.1)
Calcium: 9.1 mg/dL (ref 8.4–10.4)
EGFR: >60 mL/min/{1.73_m2} (ref >60)
Glucose: 128 mg/dl (ref 70–140)
POTASSIUM: 3.4 meq/L — AB (ref 3.5–5.1)
Sodium: 141 mEq/L (ref 136–145)
Total Bilirubin: 0.24 mg/dL (ref 0.20–1.20)
Total Protein: 6.4 g/dL (ref 6.4–8.3)

## 2017-01-07 MED ORDER — DOXORUBICIN HCL CHEMO IV INJECTION 2 MG/ML
25.0000 mg/m2 | Freq: Once | INTRAVENOUS | Status: AC
  Administered 2017-01-07: 54 mg via INTRAVENOUS
  Filled 2017-01-07: qty 27

## 2017-01-07 MED ORDER — SODIUM CHLORIDE 0.9% FLUSH
10.0000 mL | Freq: Once | INTRAVENOUS | Status: AC
  Administered 2017-01-07: 10 mL
  Filled 2017-01-07: qty 10

## 2017-01-07 MED ORDER — PALONOSETRON HCL INJECTION 0.25 MG/5ML
INTRAVENOUS | Status: AC
  Filled 2017-01-07: qty 5

## 2017-01-07 MED ORDER — HEPARIN SOD (PORK) LOCK FLUSH 100 UNIT/ML IV SOLN
500.0000 [IU] | Freq: Once | INTRAVENOUS | Status: AC | PRN
  Administered 2017-01-07: 500 [IU]
  Filled 2017-01-07: qty 5

## 2017-01-07 MED ORDER — SODIUM CHLORIDE 0.9 % IV SOLN
375.0000 mg/m2 | Freq: Once | INTRAVENOUS | Status: AC
  Administered 2017-01-07: 800 mg via INTRAVENOUS
  Filled 2017-01-07: qty 40

## 2017-01-07 MED ORDER — FOSAPREPITANT DIMEGLUMINE INJECTION 150 MG
Freq: Once | INTRAVENOUS | Status: AC
  Administered 2017-01-07: 10:00:00 via INTRAVENOUS
  Filled 2017-01-07: qty 5

## 2017-01-07 MED ORDER — DULOXETINE HCL 60 MG PO CPEP: 60.0000 mg | ORAL_CAPSULE | Freq: Every day | ORAL | 1 refills | Status: DC

## 2017-01-07 MED ORDER — SODIUM CHLORIDE 0.9 % IV SOLN
Freq: Once | INTRAVENOUS | Status: AC
  Administered 2017-01-07: 10:00:00 via INTRAVENOUS

## 2017-01-07 MED ORDER — TRAZODONE HCL 100 MG PO TABS: 100.0000 mg | ORAL_TABLET | Freq: Every evening | ORAL | 1 refills | Status: DC | PRN

## 2017-01-07 MED ORDER — PALONOSETRON HCL INJECTION 0.25 MG/5ML
0.2500 mg | Freq: Once | INTRAVENOUS | Status: AC
  Administered 2017-01-07: 0.25 mg via INTRAVENOUS

## 2017-01-07 MED ORDER — VINBLASTINE SULFATE CHEMO INJECTION 1 MG/ML
6.0500 mg/m2 | Freq: Once | INTRAVENOUS | Status: AC
  Administered 2017-01-07: 13 mg via INTRAVENOUS
  Filled 2017-01-07: qty 13

## 2017-01-07 MED ORDER — SODIUM CHLORIDE 0.9% FLUSH
10.0000 mL | INTRAVENOUS | Status: DC | PRN
  Administered 2017-01-07: 10 mL
  Filled 2017-01-07: qty 10

## 2017-01-07 NOTE — Patient Instructions (Signed)
Implanted Port Home Guide An implanted port is a type of central line that is placed under the skin. Central lines are used to provide IV access when treatment or nutrition needs to be given through a person's veins. Implanted ports are used for long-term IV access. An implanted port may be placed because:  You need IV medicine that would be irritating to the small veins in your hands or arms.  You need long-term IV medicines, such as antibiotics.  You need IV nutrition for a long period.  You need frequent blood draws for lab tests.  You need dialysis.  Implanted ports are usually placed in the chest area, but they can also be placed in the upper arm, the abdomen, or the leg. An implanted port has two main parts:  Reservoir. The reservoir is round and will appear as a small, raised area under your skin. The reservoir is the part where a needle is inserted to give medicines or draw blood.  Catheter. The catheter is a thin, flexible tube that extends from the reservoir. The catheter is placed into a large vein. Medicine that is inserted into the reservoir goes into the catheter and then into the vein.  How will I care for my incision site? Do not get the incision site wet. Bathe or shower as directed by your health care provider. How is my port accessed? Special steps must be taken to access the port:  Before the port is accessed, a numbing cream can be placed on the skin. This helps numb the skin over the port site.  Your health care provider uses a sterile technique to access the port. ? Your health care provider must put on a mask and sterile gloves. ? The skin over your port is cleaned carefully with an antiseptic and allowed to dry. ? The port is gently pinched between sterile gloves, and a needle is inserted into the port.  Only "non-coring" port needles should be used to access the port. Once the port is accessed, a blood return should be checked. This helps ensure that the port  is in the vein and is not clogged.  If your port needs to remain accessed for a constant infusion, a clear (transparent) bandage will be placed over the needle site. The bandage and needle will need to be changed every week, or as directed by your health care provider.  Keep the bandage covering the needle clean and dry. Do not get it wet. Follow your health care provider's instructions on how to take a shower or bath while the port is accessed.  If your port does not need to stay accessed, no bandage is needed over the port.  What is flushing? Flushing helps keep the port from getting clogged. Follow your health care provider's instructions on how and when to flush the port. Ports are usually flushed with saline solution or a medicine called heparin. The need for flushing will depend on how the port is used.  If the port is used for intermittent medicines or blood draws, the port will need to be flushed: ? After medicines have been given. ? After blood has been drawn. ? As part of routine maintenance.  If a constant infusion is running, the port may not need to be flushed.  How long will my port stay implanted? The port can stay in for as long as your health care provider thinks it is needed. When it is time for the port to come out, surgery will be   done to remove it. The procedure is similar to the one performed when the port was put in. When should I seek immediate medical care? When you have an implanted port, you should seek immediate medical care if:  You notice a bad smell coming from the incision site.  You have swelling, redness, or drainage at the incision site.  You have more swelling or pain at the port site or the surrounding area.  You have a fever that is not controlled with medicine.  This information is not intended to replace advice given to you by your health care provider. Make sure you discuss any questions you have with your health care provider. Document  Released: 01/20/2005 Document Revised: 06/28/2015 Document Reviewed: 09/27/2012 Else[employer] Interactive Patient Education  2017 [employer]  

## 2017-01-07 NOTE — Progress Notes (Signed)
Marland Kitchen    HEMATOLOGY/ONCOLOGY CLINIC NOTE  Date of Service: 01/07/2017   Patient Care Team: Donald Prose, MD as PCP - General (Family Medicine)  CHIEF COMPLAINTS/PURPOSE OF CONSULTATION:  F/u for management of Hodgkins lymphoma  HISTORY OF PRESENTING ILLNESS:   Rhonda Mccann is a wonderful 46 y.o. female who has been referred to [place] by Dr .Donald Prose, MD for evaluation and management of newly diagnosed Hodgkins Lymphoma.  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  Patient first reported noticing a lump in the left side of her neck in January 2018. She noted some mild progressive increase in this lump which appeared more prominent and she reported it to her primary care physician. Patient had an ultrasound of her neck on 07/07/2016 which showed a 1.9 x 0.8 x 1.4 cm lymph node with no distinct fatty hilum. This was subsequently biopsied under ultrasound guidance on 07/21/2016 and findings are consistent with classical Hodgkin's lymphoma nodular sclerosis subtype.  Patient notes no other overtly palpable lymphadenopathy noted. No fevers no chills no night sweats no unexpected weight loss. She reports no lethargy no anorexia. Overall feels quite well.  INTERVAL HISTORY  Pt presents for follow-up accompanied by her husband prior to [place]. Since last being seen, she has overall been doing well. She would like to have her PET scan done before the end of the year. She states she started Cymbalta for nerve pain and hot flashes, which she tolerated well with some relief of her hot flashes and nerve pains, but the symptoms not completely controlled. Ok with increasing the dose of Cymbalta. Emotionally, she states she has been about the same since her last visit. She recently went on a spiritual retreat. she reports that the distal portion of her plantar feet have remained numb  throughout her treatment, however, she has now developed occasional shooting pains over the area as well. Blood counts have remained stable. Nothing in the hands and toes on the right side. She states she cannot tell a pattern with this. She is planning to reschedule her missed PT appointment at outpatient rehab.  On review of systems, pt reports grade 1-2 numbness accompanied by electric-like pain to her legs and feet, hot flashes (2x daily), nerve pain, changes in BM and any other accompanying symptoms.  MEDICAL HISTORY:  Past Medical History:  Diagnosis Date  . Depression   . Drug abuse ([place]) 2001  EX alcohol abuse sober for 17 years  Donated left kidney in 2013 (single kidney) Scoliosis GERD  SURGICAL HISTORY: Past Surgical History:  Procedure Laterality Date  . IR FLUORO GUIDE PORT INSERTION RIGHT  08/05/2016  . IR US GUIDE VASC ACCESS RIGHT  08/05/2016  . NEPHRECTOMY Left 2015  . NEPHRECTOMY LIVING DONOR  2013  Breast Augmentation Surgery in 1994 (saline implants)  SOCIAL HISTORY: Social History   Socioeconomic History  . Marital status: Married    Spouse name: Not on file  . Number of children: Not on file  . Years of education: Not on file  . Highest education level: Not on file  Social Needs  . Financial resource strain: Not on file  . Food insecurity - worry: Not on file  . Food insecurity - inability: Not on file  . Transportation needs - medical: Not on file  . Transportation needs - non-medical: Not on file  Occupational History  . Not on  file  Tobacco Use  . Smoking status: Former Smoker    Types: Cigarettes    Last attempt to quit: 1998    Years since quitting: 20.9  . Smokeless tobacco: Never Used  . Tobacco comment: quit 17 yrs ago  Substance and Sexual Activity  . Alcohol use: No  . Drug use: No    Comment: Previous drug user - quite 2001  . Sexual activity: Yes  Other Topics Concern  . Not on file  Social History Narrative  . Not on file  Works  as a Production manager   FAMILY HISTORY: Paternal aunt had breast cancer Paternal grandfather had lymphoma  ALLERGIES:  is allergic to contrast media [iodinated diagnostic agents] and nsaids.  MEDICATIONS:  Current Outpatient Medications  Medication Sig Dispense Refill  . dexamethasone (DECADRON) 4 MG tablet Take 2 tablets (8 mg total) by mouth See admin instructions. 8mg  po with breakfast and lunch for 2 days after each chemotherapy 30 tablet 2  . DULoxetine (CYMBALTA) 60 MG capsule Take 1 capsule (60 mg total) by mouth daily. 30 capsule 1  . HYDROcodone-acetaminophen (NORCO) 5-325 MG tablet Take 1-2 tablets by mouth every 6 (six) hours as needed for moderate pain. 30 tablet 0  . lidocaine-prilocaine (EMLA) cream Apply 1 application topically as needed. 30 g 0  . LORazepam (ATIVAN) 0.5 MG tablet Take 1 tablet (0.5 mg total) every 8 (eight) hours by mouth. 30 tablet 0  . magic mouthwash w/lidocaine SOLN Take 5 mLs by mouth 4 (four) times daily as needed for mouth pain. 1 part viscous lidocaine 2% 1 part maalox 1 part nystatin 500,000 IU per 67ml 120 mL 0  . omeprazole (PRILOSEC) 20 MG capsule Take 20 mg by mouth daily.    . ondansetron (ZOFRAN) 8 MG tablet Take 1 tablet (8 mg total) by mouth every 8 (eight) hours as needed for nausea or vomiting. 30 tablet 1  . prochlorperazine (COMPAZINE) 10 MG tablet Take 1 tablet (10 mg total) by mouth every 6 (six) hours as needed for nausea or vomiting. 30 tablet 0  . traZODone (DESYREL) 100 MG tablet Take 1 tablet (100 mg total) by mouth at bedtime as needed for sleep. 30 tablet 1   No current facility-administered medications for this visit.     REVIEW OF SYSTEMS:    10 Point review of Systems was done is negative except as noted above.  PHYSICAL EXAMINATION: ECOG PERFORMANCE STATUS: 0 - Asymptomatic  .VS as per EPIC GENERAL:alert, in no acute distress and comfortable SKIN: no acute rashes, no significant lesions EYES: conjunctiva are pink and  non-injected, sclera anicteric OROPHARYNX: MMM, no exudates, no oropharyngeal erythema or ulceration NECK: supple, no JVD LYMPH: no palpable lymphadenopathy in the cervical,axillary or inguinal regions. Previous small cervical LN not palpable at this time. LUNGS: clear to auscultation b/l with normal respiratory effort HEART: regular rate & rhythm ABDOMEN:  normoactive bowel sounds , non tender, not distended. Extremity: no pedal edema PSYCH: alert & oriented x 3 with fluent speech NEURO: no focal motor/sensory deficits  LABORATORY DATA:  I have reviewed the data as listed  . CBC Latest Ref Rng & Units 01/07/2017 12/23/2016 12/09/2016  WBC 3.9 - 10.3 10e3/uL 8.4 7.4 11.8(H)  Hemoglobin 11.6 - 15.9 g/dL 10.7(L) 10.8(L) 10.0(L)  Hematocrit 34.8 - 46.6 % 34.7(L) 34.3(L) 32.2(L)  Platelets 145 - 400 10e3/uL 226 230 198   ANC 700 . CMP Latest Ref Rng & Units 01/07/2017 12/23/2016 12/09/2016  Glucose 70 -  140 mg/dl 128 119 125  BUN 7.0 - 26.0 mg/dL 7.2 6.0(L) 6.6(L)  Creatinine 0.6 - 1.1 mg/dL 0.9 0.9 1.0  Sodium 136 - 145 mEq/L 141 140 140  Potassium 3.5 - 5.1 mEq/L 3.4(L) 3.6 3.6  CO2 22 - 29 mEq/L 22 23 24   Calcium 8.4 - 10.4 mg/dL 9.1 9.1 9.0  Total Protein 6.4 - 8.3 g/dL 6.4 6.5 6.4  Total Bilirubin 0.20 - 1.20 mg/dL 0.24 0.22 0.27  Alkaline Phos 40 - 150 U/L 91 93 89  AST 5 - 34 U/L 29 20 16   ALT 0 - 55 U/L 49 35 27         RADIOGRAPHIC STUDIES: I have personally reviewed the radiological images as listed and agreed with the findings in the report. No results found.  ASSESSMENT & PLAN:   47 yo very pleasant caucasian female with   1) Stage IIA (adverse risk due to >3 regions of involvement, non bulky) Classical Hodgkins Lymphoma -Nodular Sclerosis Subtype No constitutional symptoms. Sed rate 18 (WNL) PET/CT reviewed ECHO EF 60-65% DLCO uncorrected 70% of predicted. 24h creatinine clearance WNL.  Patient is s/p 5 cycles of rx -PET from 10/07/2016 reviewed. Near  complete metabolic response to therapy and no residual disease > [place] 2  2) Grade 1 -nausea -resolved -patient has prn zofran and compazine.  3) Grade 1 mucositis - resolved -continue the use of oral cryotherapy during chemotherapy -continue salt/baking soda mouthwashes  4) Neutropenia from chemotherapy. Now resolved with the use of neulasta. 5) Neulasta related bone pains -controlled with prn pain medications. Plan -continue Neulasta D3 and D17 every cycle -vicodin prn for bone pains. -daily loratadine for 5-7 days with each cycle to reduce Neulasta-related bone pains.  6) Insomnia - she has trouble sleeping some nights trazodone prn  -increase Trazodone to 100mg  to aid with persisting insomnia   7) Grade 1 neuropathy from ctx. Will monitor on treatment. Partly controlled with Cymbalta Plan -Labs stable. No prohibitive toxicities-patient appropriate to proceed with C6D1 of treatment (AVD) today -no prohibitive toxicities at this time that require overt treatment changes -continue neulasta support -  plan to increase Cymbalta to 60mg  po daily (for her hot flashes/neuropathy and depression). F/u with cancer rehab as well to address fatigue.  8) Single kidney - patient donated left kidney in 2013 -avoid nephrotoxic medications -Creatinine stable  9) Perimenopausal status -The patient reports that she believes that she is perimenopausal, she has not had a menses since beginning her treatment. She also expresses that she has been having mood irritability, hot flashes, and night sweats.  - I encouraged her to remain well hydrated, reduce caffeine intake, and remaining active. I also informed her that she can try taking Vitamin E to help with hot flashes and try drinking evening prim rose tea and taking massages as well.  -increase dose of Cymbalta to 60mg  to help neuropathy, mood and hot flashes  -Overall tolerating treatment very well-labs are stable at this time -Increase  dose of Trazodone to 100mg  daily  -Increase dose of Cymbalta to 60mg  daily  -Complete C6D15 as per schedule in 2 weeks with labs -PET/CT on 12/27 or 01/30/2017  RTC with Dr Irene Limbo with labs in 1st week of Jan 2019   All of the patients questions were answered with apparent satisfaction. The patient knows to call the clinic with any problems, questions or concerns.  I spent 20 minutes counseling the patient face to face. The total time spent in the appointment  was 25 minutes and more than 50% was on counseling and direct patient cares.  .I have reviewed the above documentation for accuracy and completeness, and I agree with the above.  Sullivan Lone MD [address] AAHIVMS [facility] Hematology/Oncology Physician [facility]  (Office):       [phone] (Work cell):  [phone] (Fax):           [phone]  This document serves as a record of services personally performed by Sullivan Lone, MD. It was created on his behalf by Alean Rinne, a trained medical scribe. The creation of this record is based on the scribe's personal observations and the provider's statements to them.   .I have reviewed the above documentation for accuracy and completeness, and I agree with the above. Brunetta Genera MD MS

## 2017-01-07 NOTE — Patient Instructions (Signed)
[facility] Discharge Instructions for Patients Receiving Chemotherapy  Today you received the following chemotherapy agents:  Adriamycin, Vinblastine, Dacarbazine  To help prevent nausea and vomiting after your treatment, we encourage you to take your nausea medication as prescribed.   If you develop nausea and vomiting that is not controlled by your nausea medication, call the clinic.   BELOW ARE SYMPTOMS THAT SHOULD BE REPORTED IMMEDIATELY:  *FEVER GREATER THAN 100.5 F  *CHILLS WITH OR WITHOUT FEVER  NAUSEA AND VOMITING THAT IS NOT CONTROLLED WITH YOUR NAUSEA MEDICATION  *UNUSUAL SHORTNESS OF BREATH  *UNUSUAL BRUISING OR BLEEDING  TENDERNESS IN MOUTH AND THROAT WITH OR WITHOUT PRESENCE OF ULCERS  *URINARY PROBLEMS  *BOWEL PROBLEMS  UNUSUAL RASH Items with * indicate a potential emergency and should be followed up as soon as possible.  Feel free to call the clinic should you have any questions or concerns. The clinic phone number is [phone].  Please show the CHEMO ALERT CARD at check-in to the Emergency Department and triage nurse.   

## 2017-01-07 NOTE — Progress Notes (Signed)
MD Irene Limbo okay to treat today with ECHO from 08/15/16 per desk RN.

## 2017-01-07 NOTE — Patient Instructions (Signed)
Thank you for choosing [facility] to provide your oncology and hematology care.  To afford each patient quality time with our providers, please arrive 30 minutes before your scheduled appointment time.  If you arrive late for your appointment, you may be asked to reschedule.  We strive to give you quality time with our providers, and arriving late affects you and other patients whose appointments are after yours.  If you are a no show for multiple scheduled visits, you may be dismissed from the clinic at the providers discretion.   Again, thank you for choosing [facility], our hope is that these requests will decrease the amount of time that you wait before being seen by our physicians.  ______________________________________________________________________ Should you have questions after your visit to the  Cancer Center, please contact our office at [phone] between the hours of 8:30 and 4:30 p.m.    Voicemails left after 4:30p.m will not be returned until the following business day.   For prescription refill requests, please have your pharmacy contact us directly.  Please also try to allow 48 hours for prescription requests.   Please contact the scheduling department for questions regarding scheduling.  For scheduling of procedures such as PET scans, CT scans, MRI, Ultrasound, etc please contact central scheduling at [phone].   Resources For Cancer Patients and Caregivers:  American Cancer Society:  800-227-2345  Can help patients locate various types of support and financial assistance Cancer Care: 1-800-813-HOPE (4673) Provides financial assistance, online support groups, medication/co-pay assistance.   [place] DSS:  336-641-3447 Where to apply for food stamps, Medicaid, and utility assistance Medicare Rights Center: 800-333-4114 Helps people with Medicare understand their rights and benefits, navigate the Medicare system, and secure the  quality healthcare they deserve SCAT: 336-333-6589 [employer]'s shared-ride transportation service for eligible riders who have a disability that prevents them from riding the fixed route bus.   For additional information on assistance programs please contact our social worker:   Grier Hock/Abigail Elmore:  [phone] 

## 2017-01-08 ENCOUNTER — Ambulatory Visit: Payer: 59 | Attending: Hematology | Admitting: Physical Therapy

## 2017-01-08 ENCOUNTER — Other Ambulatory Visit: Payer: Self-pay

## 2017-01-08 ENCOUNTER — Ambulatory Visit: Payer: 59

## 2017-01-08 DIAGNOSIS — R29898 Other symptoms and signs involving the musculoskeletal system: Secondary | ICD-10-CM | POA: Insufficient documentation

## 2017-01-08 DIAGNOSIS — R531 Weakness: Secondary | ICD-10-CM | POA: Diagnosis not present

## 2017-01-08 DIAGNOSIS — R209 Unspecified disturbances of skin sensation: Secondary | ICD-10-CM | POA: Diagnosis not present

## 2017-01-08 DIAGNOSIS — R2681 Unsteadiness on feet: Secondary | ICD-10-CM | POA: Diagnosis present

## 2017-01-08 NOTE — Therapy (Signed)
Rhonda Mccann, [place], [address] Phone: [phone]   Fax:  [phone]  Physical Therapy Treatment  Patient Details  Name: Rhonda Mccann MRN: [id number] Date of Birth: July 19, 1970 Referring Provider: Dr. Irene Limbo    Encounter Date: 01/08/2017  PT End of Session - 01/08/17 1701    Visit Number  1    Number of Visits  9    Date for PT Re-Evaluation  02/10/17    PT Start Time  1600    PT Stop Time  1645    PT Time Calculation (min)  45 min    Activity Tolerance  Patient tolerated treatment well    Behavior During Therapy  [facility] -  for tasks assessed/performed       Past Medical History:  Diagnosis Date  . Depression   . Drug abuse ([place]) 2001    Past Surgical History:  Procedure Laterality Date  . IR FLUORO GUIDE PORT INSERTION RIGHT  08/05/2016  . IR US GUIDE VASC ACCESS RIGHT  08/05/2016  . NEPHRECTOMY Left 2015  . NEPHRECTOMY LIVING DONOR  2013    There were no vitals filed for this visit.  Subjective Assessment - 01/08/17 1616    Subjective  "I had an infusion yesterday".  She has one more infustion in 2 weeks and that is her last .  Foot pain and numbnes that never goes away.     Pertinent History  diagnosed in june 2018 with Hodgkins lymphoma and has been undergoing chemo since.  As a port on right side. She has history of scoliosis with off and on chronic back pain.  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  No/denies         [facility] PT Assessment - 01/08/17 0001      Assessment   Medical Diagnosis  Hodgkins lymphoma     Referring Provider  Dr. Irene Limbo     Onset Date/Surgical Date  07/23/16    Hand Dominance  Right    Prior Therapy  none      Restrictions   Weight Bearing Restrictions  No      Balance Screen   Has the patient  fallen in the past 6 months  No    Has the patient had a decrease in activity level because of a fear of falling?   No    Is the patient reluctant to leave their home because of a fear of falling?   No      Home Environment   Living Environment  Private residence    Living Arrangements  Spouse/significant other;Children    Available Help at Discharge  Available PRN/intermittently    Type of Home  House      Prior Function   Level of Independence  Independent      Cognition   Overall Cognitive Status  Within Functional Limits for tasks assessed      Observation/Other Assessments   Observations  Pt comes in walking without device at at good pace     Skin Integrity  no open areas. port intact       Sensation   Additional Comments  pt has numbess and sensitivity in both feet all the time       Coordination  Gross Motor Movements are Fluid and Coordinated  Yes pt appears to get out of breath easily       Single Leg Stance   Comments  5 seconds with large amplitude body movements to maintain balance       Sit to Stand   Comments  11 repetitions in 30 seonds wiht pain in hips and noticeable dyspnea that took several minutes to recover  RPE   4/10       Posture/Postural Control   Posture/Postural Control  Postural limitations    Postural Limitations  Rounded Shoulders;Forward head      AROM   Overall AROM Comments  pt has good AROM against gravity, but appears to have generalized deconditioning       Strength   Right Hand Grip (lbs)  55/60/58    Left Hand Grip (lbs)  50/48/50      Ambulation/Gait   Gait velocity  3.95 for 5 meters : .25 m/sec which is community ambulator       6 Minute Walk- Baseline   6 Minute Walk- Baseline  yes      6 Minute walk- Post Test   6 Minute Walk Post Test  yes    HR (bpm)  113    02 Sat (%RA)  98 %      6 minute walk test results    Aerobic Endurance Distance Walked  1157    Endurance additional comments  pt with minimal dyspnea.       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     Time  4    Period  Weeks    Status  New      PT LONG TERM GOAL #4   Title  Pt wil decrease TUG score to 6 seconds indicting and improvment in dynamic balance and gair     Baseline  7.17 seconds     Time  4    Period  Weeks    Status  New            Plan - 01/08/17 1702    Clinical Impression Statement  46 yo female who has been undergoing chemotherapy for 6 months for Hodgkins Lymphoma.  She has one more chemo treatment and wants to return to work  in January.  She has symptoms of CIPN in both feet and wants to try some inteventions to help with symptoms as well as do exercises to get stronger so that she can return to work and to exercise opportunities in the community     History and Personal Factors relevant to plan of care:  chemotherapy induced peripheral neuropathy and deconditioning.. history of scoliosis and intermittent chronic back pain     Clinical Presentation  Evolving    Clinical Presentation due to:  ongoing chemo.     Clinical Decision Making  Moderate    Rehab Potential  Good    Clinical Impairments Affecting Rehab Potential  CIPN, ongoing chemo treatment     PT Frequency  2x / week    PT  Duration  4 weeks    PT Treatment/Interventions  ADLs/Self Care Home Management;Balance training;Patient/family education;Gait training;Stair training;Functional mobility training;Therapeutic activities;Therapeutic exercise;Moist Heat;Electrical Stimulation;Cryotherapy;Manual techniques    PT Next Visit Plan  Teach exercise program including stretching, strengthening and aerobic components.  Use TENS/contrast bath., moist heat/ massage as indicated to treat CIPN symptoms. Add balance exercises on airex/ therapuetic ball/ BOSU as indidcated.  consider Strength ABC program and educated about comminutyexercise     Consulted and Agree with Plan of Care  Patient       Patient will benefit from skilled therapeutic intervention in order to improve the following deficits and impairments:  Decreased endurance, Impaired sensation, Obesity, Decreased strength, Pain, Postural dysfunction, Decreased balance, Decreased activity tolerance  Visit Diagnosis: Generalized weakness - Plan: PT plan of care cert/re-cert  Unsteadiness on feet - Plan: PT plan of care cert/re-cert  Unspecified disturbances of skin sensation - Plan: PT plan of care cert/re-cert  Other symptoms and signs involving the musculoskeletal system - Plan: PT plan of care cert/re-cert     Problem List Patient Active Problem List   Diagnosis Date Noted  . Port catheter in place 09/30/2016  . Lymphocyte-rich classic Hodgkin lymphoma lymph nodes multiple sites ([place]) 08/14/2016   Donato Heinz. Owens Shark PT  [name] 01/08/2017, 5:19 PM  [place], [place], [address] Phone: [phone]   Fax:  [phone]  Name: Rhonda Mccann MRN: [id number] Date of Birth: 1970-11-29

## 2017-01-09 ENCOUNTER — Ambulatory Visit (HOSPITAL_BASED_OUTPATIENT_CLINIC_OR_DEPARTMENT_OTHER): Payer: 59

## 2017-01-09 VITALS — BP 125/69 | HR 85 | Temp 97.2°F | Resp 12

## 2017-01-09 DIAGNOSIS — Z5189 Encounter for other specified aftercare: Secondary | ICD-10-CM | POA: Diagnosis not present

## 2017-01-09 DIAGNOSIS — C8148 Lymphocyte-rich classical Hodgkin lymphoma, lymph nodes of multiple sites: Secondary | ICD-10-CM | POA: Diagnosis not present

## 2017-01-09 MED ORDER — PEGFILGRASTIM INJECTION 6 MG/0.6ML ~~LOC~~
6.0000 mg | PREFILLED_SYRINGE | Freq: Once | SUBCUTANEOUS | Status: AC
  Administered 2017-01-09: 6 mg via SUBCUTANEOUS
  Filled 2017-01-09: qty 0.6

## 2017-01-14 ENCOUNTER — Ambulatory Visit: Payer: 59 | Admitting: Physical Therapy

## 2017-01-14 DIAGNOSIS — R531 Weakness: Secondary | ICD-10-CM

## 2017-01-14 DIAGNOSIS — R209 Unspecified disturbances of skin sensation: Secondary | ICD-10-CM

## 2017-01-14 DIAGNOSIS — R29898 Other symptoms and signs involving the musculoskeletal system: Secondary | ICD-10-CM

## 2017-01-14 DIAGNOSIS — R2681 Unsteadiness on feet: Secondary | ICD-10-CM

## 2017-01-14 NOTE — Therapy (Signed)
[place], [place], [address] Phone: [phone]   Fax:  [phone]  Physical Therapy Treatment  Patient Details  Name: Rhonda Mccann MRN: [id number] Date of Birth: 04/08/1970 Referring Provider: Dr. Irene Limbo    Encounter Date: 01/14/2017  PT End of Session - 01/14/17 1729    Visit Number  2    Number of Visits  9    Date for PT Re-Evaluation  02/10/17    PT Start Time  1520    PT Stop Time  1606    PT Time Calculation (min)  46 min    Activity Tolerance  Patient tolerated treatment well    Behavior During Therapy  [facility] for tasks assessed/performed       Past Medical History:  Diagnosis Date  . Depression   . Drug abuse ([place]) 2001    Past Surgical History:  Procedure Laterality Date  . IR FLUORO GUIDE PORT INSERTION RIGHT  08/05/2016  . IR US GUIDE VASC ACCESS RIGHT  08/05/2016  . NEPHRECTOMY Left 2015  . NEPHRECTOMY LIVING DONOR  2013    There were no vitals filed for this visit.  Subjective Assessment - 01/14/17 1522    Subjective  Nothing new today. I haven't bought a TENS machine yet,, she had suggested that.  Has tried heat and cold for her feet; the heat feels better. Massage helps too. Hasn't tried compression stockings.    Pertinent History  diagnosed in june 2018 with Hodgkins lymphoma and has been undergoing chemo since.  As a port on right side. She has history of scoliosis with off and on chronic back pain.  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  No/denies                      [facility] Adult PT Treatment/Exercise - 01/14/17 0001      Self-Care   Self-Care  Other Self-Care Comments    Other Self-Care Comments   Discussed some treatment options for neuropathy,including compression stockings, contrast baths, massage. Gave handout on contrast baths, should patient want to try those at home.  Gave info  about where to obtain compression stockings (knee highs, 20-30 mmHg).  Discussed use of TENS and electrode placement options.      Modalities   Modalities  Electrical engineer Stimulation Location  right foot, dorsal and plantar surfaces    Electrical Stimulation Action  modulated TENS with portable unit    Electrical Stimulation Parameters  strong but comfortable sensation (level 30-35 intensity), 18 mins.    Electrical Stimulation Goals  Pain      Manual Therapy   Manual Therapy  Other (comment)    Other Manual Therapy  soft tissue work with Biotone to left foot, ankle, and lower leg with both legs elevated (while TENS was on right foot)             PT Education - 01/14/17 1728    Education provided  Yes    Education Details  about massage, TENS, compression stockings, contrast baths to treat neuropathy    Person(s) Educated  Patient    Methods  Explanation;Handout    Comprehension  Verbalized understanding          PT Long Term Goals - 01/08/17 1713      PT LONG  TERM GOAL #1   Title  Pt will report that she has 50% feelings of generalized weakness and fatigue so that 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     Time  4    Period  Weeks    Status  New      PT LONG TERM GOAL #4   Title  Pt wil decrease TUG score to 6 seconds indicting and improvment in dynamic balance and gair     Baseline  7.17 seconds     Time  4    Period  Weeks    Status  New            Plan - 01/14/17 1729    Clinical Impression  Statement  Began course of treatment today with focus on neuropathy, both with education about options and some treatment. Tried TENS to right foot and massage to left, for patient to see if either or both of these were beneficial.  She did report benefit from both at end of session.  She also seemed amenable to trying other options including compression stockings and contrast baths, and was given information about those.    Rehab Potential  Good    Clinical Impairments Affecting Rehab Potential  CIPN, ongoing chemo treatment     PT Frequency  2x / week    PT Duration  4 weeks    PT Treatment/Interventions  ADLs/Self Care Home Management;Balance training;Patient/family education;Gait training;Stair training;Functional mobility training;Therapeutic activities;Therapeutic exercise;Moist Heat;Electrical Stimulation;Cryotherapy;Manual techniques    PT Next Visit Plan  Assess benefit of first treatment session and whether patient did some other treatments on her own.  Teach exercise program including stretching, strengthening, and aerobic components.  Continue CIPN treatments prn.  Add balance on Airex, ball, BOSU; consider strength ABC program and educate about community exercise OR using the equipment she has at home.    Consulted and Agree with Plan of Care  Patient       Patient will benefit from skilled therapeutic intervention in order to improve the following deficits and impairments:  Decreased endurance, Impaired sensation, Obesity, Decreased strength, Pain, Postural dysfunction, Decreased balance, Decreased activity tolerance  Visit Diagnosis: Generalized weakness  Unsteadiness on feet  Unspecified disturbances of skin sensation  Other symptoms and signs involving the musculoskeletal system     Problem List Patient Active Problem List   Diagnosis Date Noted  . Port catheter in place 09/30/2016  . Lymphocyte-rich classic Hodgkin lymphoma lymph nodes multiple sites [facility]) 08/14/2016     Amarachukwu Lakatos 01/14/2017, 5:35 PM  [place], [place], [address] Phone: [phone]   Fax:  [phone]  Name: DONYALE FALCON MRN: [id number] Date of Birth: 1970/03/09  Serafina Royals, PT 01/14/17 5:35 PM

## 2017-01-16 ENCOUNTER — Encounter: Payer: Self-pay | Admitting: Physical Therapy

## 2017-01-16 ENCOUNTER — Ambulatory Visit: Payer: 59 | Admitting: Physical Therapy

## 2017-01-16 DIAGNOSIS — R29898 Other symptoms and signs involving the musculoskeletal system: Secondary | ICD-10-CM

## 2017-01-16 DIAGNOSIS — R531 Weakness: Secondary | ICD-10-CM | POA: Diagnosis not present

## 2017-01-16 DIAGNOSIS — R2681 Unsteadiness on feet: Secondary | ICD-10-CM

## 2017-01-16 DIAGNOSIS — R209 Unspecified disturbances of skin sensation: Secondary | ICD-10-CM

## 2017-01-16 NOTE — Therapy (Addendum)
[place], [place], [address] Phone: [phone]   Fax:  [phone]  Physical Therapy Treatment  Patient Details  Name: Rhonda Mccann MRN: [id number] Date of Birth: 02-13-1970 Referring Provider: Dr. Irene Mccann    Encounter Date: 01/16/2017  PT End of Session - 01/16/17 1233    Visit Number  3    Number of Visits  9    Date for PT Re-Evaluation  02/10/17    PT Start Time  1950    PT Stop Time  1100    PT Time Calculation (min)  45 min    Activity Tolerance  Patient tolerated treatment well    Behavior During Therapy  [facility] for tasks assessed/performed       Past Medical History:  Diagnosis Date  . Depression   . Drug abuse ([place]) 2001    Past Surgical History:  Procedure Laterality Date  . IR FLUORO GUIDE PORT INSERTION RIGHT  08/05/2016  . IR US GUIDE VASC ACCESS RIGHT  08/05/2016  . NEPHRECTOMY Left 2015  . NEPHRECTOMY LIVING DONOR  2013    There were no vitals filed for this visit.  Subjective Assessment - 01/16/17 1227    Subjective  Pt reports she has to go back to work on Dec. 23.  She [place] get [place]  She is interested in learning the Strength ABC program     Pertinent History  diagnosed in june 2018 with Hodgkins lymphoma and has been undergoing chemo since.  As a port on right side. She has history of scoliosis with off and on chronic back pain.  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  No/denies                      [facility] Adult PT Treatment/Exercise - 01/16/17 0001      Exercises   Exercises  Other Exercises    Other Exercises   Instructed in the Strength ABC stretches, core and strength exetcises, doing about 1-5 reps of each with 1# weight with cues for proper form and alignment               PT Education - 01/16/17 1232    Education provided  Yes    Education Details  Strength ABC program and log.  Information about getting a TENS unit on her own.     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     Time  4    Period  Weeks    Status  On-going      PT LONG TERM GOAL #4   Title  Pt wil decrease TUG score to 6 seconds indicting and improvment in dynamic balance and gair     Baseline  7.17 seconds     Time  4    Status  On-going            Plan - 01/16/17 1233    Clinical Impression Statement  Pt did very well with Strength ABC program and seemed to enjoy the exercise and realize it will be beneficial for her.  She feels like she will be able to continue at home     Rehab Potential  Good    Clinical Impairments Affecting Rehab Potential  CIPN, ongoing chemo treatment     PT Frequency  2x / week    PT Duration  4 weeks    PT Treatment/Interventions  ADLs/Self Care Home Management;Balance training;Patient/family education;Gait training;Stair training;Functional mobility training;Therapeutic activities;Therapeutic exercise;Moist Heat;Electrical Stimulation;Cryotherapy;Manual techniques    PT Next Visit Plan  answer questions about  CIPN treatments and strength ABC program prn. Next session  focus  on balance activiites on Airex, ball, BOSU;  and educate about community exercise OR using the equipment she has at home.    Consulted and Agree with Plan of Care  Patient       Patient will benefit from skilled therapeutic intervention in order to improve the following deficits and impairments:  Decreased endurance, Impaired sensation, Obesity, Decreased strength, Pain, Postural dysfunction, Decreased balance, Decreased activity tolerance  Visit Diagnosis: Generalized weakness  Unsteadiness on feet  Unspecified disturbances of skin sensation  Other symptoms and signs involving the musculoskeletal system     Problem List Patient Active Problem List   Diagnosis Date Noted  . Port catheter in place 09/30/2016  . Lymphocyte-rich classic Hodgkin lymphoma lymph nodes multiple sites ([place]) 08/14/2016   Rhonda Mccann, PT  Rhonda Mccann 01/16/2017, 12:37 PM  [place], [place], [address] Phone: [phone]   Fax:  [phone]  Name: Rhonda Mccann MRN: [id number] Date of Birth: [birth year]  PHYSICAL THERAPY DISCHARGE SUMMARY  Visits from Start of Care: 3 Current functional level related to goals / functional outcomes: As above   Remaining deficits: As above    Education / Equipment: As above  Plan: Patient agrees to discharge.  Patient goals were partially met. Patient is being discharged due to not returning since the last visit.  ?????    Rhonda Mccann, PT 04/09/17 9:51 AM

## 2017-01-19 ENCOUNTER — Ambulatory Visit (HOSPITAL_BASED_OUTPATIENT_CLINIC_OR_DEPARTMENT_OTHER): Payer: 59

## 2017-01-19 VITALS — BP 128/73 | HR 95 | Temp 98.3°F | Resp 16 | Wt 204.0 lb

## 2017-01-19 DIAGNOSIS — Z5111 Encounter for antineoplastic chemotherapy: Secondary | ICD-10-CM

## 2017-01-19 DIAGNOSIS — Z95828 Presence of other vascular implants and grafts: Secondary | ICD-10-CM

## 2017-01-19 DIAGNOSIS — C8148 Lymphocyte-rich classical Hodgkin lymphoma, lymph nodes of multiple sites: Secondary | ICD-10-CM

## 2017-01-19 LAB — CBC & DIFF AND RETIC
BASO%: 0.5 % (ref 0.0–2.0)
BASOS ABS: 0.1 10*3/uL (ref 0.0–0.1)
EOS ABS: 0 10*3/uL (ref 0.0–0.5)
EOS%: 0.2 % (ref 0.0–7.0)
HEMATOCRIT: 34.2 % — AB (ref 34.8–46.6)
HGB: 10.6 g/dL — ABNORMAL LOW (ref 11.6–15.9)
IMMATURE RETIC FRACT: 28.6 % — AB (ref 1.60–10.00)
LYMPH#: 1.9 10*3/uL (ref 0.9–3.3)
LYMPH%: 18.4 % (ref 14.0–49.7)
MCH: 32.4 pg (ref 25.1–34.0)
MCHC: 31 g/dL — ABNORMAL LOW (ref 31.5–36.0)
MCV: 104.6 fL — ABNORMAL HIGH (ref 79.5–101.0)
MONO#: 0.7 10*3/uL (ref 0.1–0.9)
MONO%: 6.8 % (ref 0.0–14.0)
NEUT#: 7.8 10*3/uL — ABNORMAL HIGH (ref 1.5–6.5)
NEUT%: 74.1 % (ref 38.4–76.8)
PLATELETS: 259 10*3/uL (ref 145–400)
RBC: 3.27 10*6/uL — ABNORMAL LOW (ref 3.70–5.45)
RDW: 16.4 % — AB (ref 11.2–14.5)
RETIC %: 7.27 % — AB (ref 0.70–2.10)
RETIC CT ABS: 237.73 10*3/uL — AB (ref 33.70–90.70)
WBC: 10.6 10*3/uL — ABNORMAL HIGH (ref 3.9–10.3)

## 2017-01-19 LAB — COMPREHENSIVE METABOLIC PANEL
ALT: 46 U/L (ref 0–55)
ANION GAP: 11 meq/L (ref 3–11)
AST: 24 U/L (ref 5–34)
Albumin: 3.9 g/dL (ref 3.5–5.0)
Alkaline Phosphatase: 113 U/L (ref 40–150)
BILIRUBIN TOTAL: 0.23 mg/dL (ref 0.20–1.20)
BUN: 7 mg/dL (ref 7.0–26.0)
CALCIUM: 9.1 mg/dL (ref 8.4–10.4)
CHLORIDE: 106 meq/L (ref 98–109)
CO2: 24 meq/L (ref 22–29)
CREATININE: 0.9 mg/dL (ref 0.6–1.1)
EGFR: >60 mL/min/{1.73_m2} (ref >60)
Glucose: 129 mg/dl (ref 70–140)
Potassium: 3.6 mEq/L (ref 3.5–5.1)
Sodium: 141 mEq/L (ref 136–145)
TOTAL PROTEIN: 6.6 g/dL (ref 6.4–8.3)

## 2017-01-19 MED ORDER — SODIUM CHLORIDE 0.9% FLUSH
10.0000 mL | Freq: Once | INTRAVENOUS | Status: AC
  Administered 2017-01-19: 10 mL
  Filled 2017-01-19: qty 10

## 2017-01-19 MED ORDER — PALONOSETRON HCL INJECTION 0.25 MG/5ML
0.2500 mg | Freq: Once | INTRAVENOUS | Status: AC
  Administered 2017-01-19: 0.25 mg via INTRAVENOUS

## 2017-01-19 MED ORDER — SODIUM CHLORIDE 0.9 % IV SOLN
375.0000 mg/m2 | Freq: Once | INTRAVENOUS | Status: AC
  Administered 2017-01-19: 800 mg via INTRAVENOUS
  Filled 2017-01-19: qty 40

## 2017-01-19 MED ORDER — VINBLASTINE SULFATE CHEMO INJECTION 1 MG/ML
6.0500 mg/m2 | Freq: Once | INTRAVENOUS | Status: AC
  Administered 2017-01-19: 13 mg via INTRAVENOUS
  Filled 2017-01-19: qty 13

## 2017-01-19 MED ORDER — SODIUM CHLORIDE 0.9% FLUSH
10.0000 mL | INTRAVENOUS | Status: DC | PRN
  Administered 2017-01-19: 10 mL
  Filled 2017-01-19: qty 10

## 2017-01-19 MED ORDER — HEPARIN SOD (PORK) LOCK FLUSH 100 UNIT/ML IV SOLN
500.0000 [IU] | Freq: Once | INTRAVENOUS | Status: AC | PRN
  Administered 2017-01-19: 500 [IU]
  Filled 2017-01-19: qty 5

## 2017-01-19 MED ORDER — SODIUM CHLORIDE 0.9 % IV SOLN
Freq: Once | INTRAVENOUS | Status: AC
  Administered 2017-01-19: 09:00:00 via INTRAVENOUS

## 2017-01-19 MED ORDER — SODIUM CHLORIDE 0.9 % IV SOLN
Freq: Once | INTRAVENOUS | Status: AC
  Administered 2017-01-19: 10:00:00 via INTRAVENOUS
  Filled 2017-01-19: qty 5

## 2017-01-19 MED ORDER — PALONOSETRON HCL INJECTION 0.25 MG/5ML
INTRAVENOUS | Status: AC
  Filled 2017-01-19: qty 5

## 2017-01-19 MED ORDER — DOXORUBICIN HCL CHEMO IV INJECTION 2 MG/ML
25.0000 mg/m2 | Freq: Once | INTRAVENOUS | Status: AC
  Administered 2017-01-19: 54 mg via INTRAVENOUS
  Filled 2017-01-19: qty 27

## 2017-01-19 NOTE — Patient Instructions (Signed)
[facility] Discharge Instructions for Patients Receiving Chemotherapy  Today you received the following chemotherapy agents:  Adriamycin, Vinblastine, Dacarbazine  To help prevent nausea and vomiting after your treatment, we encourage you to take your nausea medication as prescribed.   If you develop nausea and vomiting that is not controlled by your nausea medication, call the clinic.   BELOW ARE SYMPTOMS THAT SHOULD BE REPORTED IMMEDIATELY:  *FEVER GREATER THAN 100.5 F  *CHILLS WITH OR WITHOUT FEVER  NAUSEA AND VOMITING THAT IS NOT CONTROLLED WITH YOUR NAUSEA MEDICATION  *UNUSUAL SHORTNESS OF BREATH  *UNUSUAL BRUISING OR BLEEDING  TENDERNESS IN MOUTH AND THROAT WITH OR WITHOUT PRESENCE OF ULCERS  *URINARY PROBLEMS  *BOWEL PROBLEMS  UNUSUAL RASH Items with * indicate a potential emergency and should be followed up as soon as possible.  Feel free to call the clinic should you have any questions or concerns. The clinic phone number is [phone].  Please show the CHEMO ALERT CARD at check-in to the Emergency Department and triage nurse.   

## 2017-01-21 ENCOUNTER — Ambulatory Visit (HOSPITAL_BASED_OUTPATIENT_CLINIC_OR_DEPARTMENT_OTHER): Payer: 59

## 2017-01-21 VITALS — BP 125/80 | HR 89 | Temp 98.9°F | Resp 18

## 2017-01-21 DIAGNOSIS — C8148 Lymphocyte-rich classical Hodgkin lymphoma, lymph nodes of multiple sites: Secondary | ICD-10-CM | POA: Diagnosis not present

## 2017-01-21 DIAGNOSIS — Z5189 Encounter for other specified aftercare: Secondary | ICD-10-CM

## 2017-01-21 MED ORDER — PEGFILGRASTIM INJECTION 6 MG/0.6ML ~~LOC~~
PREFILLED_SYRINGE | SUBCUTANEOUS | Status: AC
  Filled 2017-01-21: qty 0.6

## 2017-01-21 MED ORDER — PEGFILGRASTIM INJECTION 6 MG/0.6ML ~~LOC~~
6.0000 mg | PREFILLED_SYRINGE | Freq: Once | SUBCUTANEOUS | Status: AC
  Administered 2017-01-21: 6 mg via SUBCUTANEOUS

## 2017-01-21 NOTE — Patient Instructions (Signed)
Pegfilgrastim injection What is this medicine? PEGFILGRASTIM (PEG fil gra stim) is a long-acting granulocyte colony-stimulating factor that stimulates the growth of neutrophils, a type of white blood cell important in the body's fight against infection. It is used to reduce the incidence of fever and infection in patients with certain types of cancer who are receiving chemotherapy that affects the bone marrow, and to increase survival after being exposed to high doses of radiation. This medicine may be used for other purposes; ask your health care provider or pharmacist if you have questions. COMMON BRAND NAME(S): Neulasta What should I tell my health care provider before I take this medicine? They need to know if you have any of these conditions: -kidney disease -latex allergy -ongoing radiation therapy -sickle cell disease -skin reactions to acrylic adhesives (On-Body Injector only) -an unusual or allergic reaction to pegfilgrastim, filgrastim, other medicines, foods, dyes, or preservatives -pregnant or trying to get pregnant -breast-feeding How should I use this medicine? This medicine is for injection under the skin. If you get this medicine at home, you will be taught how to prepare and give the pre-filled syringe or how to use the On-body Injector. Refer to the patient Instructions for Use for detailed instructions. Use exactly as directed. Tell your healthcare provider immediately if you suspect that the On-body Injector may not have performed as intended or if you suspect the use of the On-body Injector resulted in a missed or partial dose. It is important that you put your used needles and syringes in a special sharps container. Do not put them in a trash can. If you do not have a sharps container, call your pharmacist or healthcare provider to get one. Talk to your pediatrician regarding the use of this medicine in children. While this drug may be prescribed for selected conditions,  precautions do apply. Overdosage: If you think you have taken too much of this medicine contact a poison control center or emergency room at once. NOTE: This medicine is only for you. Do not share this medicine with others. What if I miss a dose? It is important not to miss your dose. Call your doctor or health care professional if you miss your dose. If you miss a dose due to an On-body Injector failure or leakage, a new dose should be administered as soon as possible using a single prefilled syringe for manual use. What may interact with this medicine? Interactions have not been studied. Give your health care provider a list of all the medicines, herbs, non-prescription drugs, or dietary supplements you use. Also tell them if you smoke, drink alcohol, or use illegal drugs. Some items may interact with your medicine. This list may not describe all possible interactions. Give your health care provider a list of all the medicines, herbs, non-prescription drugs, or dietary supplements you use. Also tell them if you smoke, drink alcohol, or use illegal drugs. Some items may interact with your medicine. What should I watch for while using this medicine? You may need blood work done while you are taking this medicine. If you are going to need a MRI, CT scan, or other procedure, tell your doctor that you are using this medicine (On-Body Injector only). What side effects may I notice from receiving this medicine? Side effects that you should report to your doctor or health care professional as soon as possible: -allergic reactions like skin rash, itching or hives, swelling of the face, lips, or tongue -dizziness -fever -pain, redness, or irritation at site   where injected -pinpoint red spots on the skin -red or dark-brown urine -shortness of breath or breathing problems -stomach or side pain, or pain at the shoulder -swelling -tiredness -trouble passing urine or change in the amount of urine Side  effects that usually do not require medical attention (report to your doctor or health care professional if they continue or are bothersome): -bone pain -muscle pain This list may not describe all possible side effects. Call your doctor for medical advice about side effects. You may report side effects to FDA at 1-800-FDA-1088. Where should I keep my medicine? Keep out of the reach of children. Store pre-filled syringes in a refrigerator between 2 and 8 degrees C (36 and 46 degrees F). Do not freeze. Keep in carton to protect from light. Throw away this medicine if it is left out of the refrigerator for more than 48 hours. Throw away any unused medicine after the expiration date. NOTE: This sheet is a summary. It may not cover all possible information. If you have questions about this medicine, talk to your doctor, pharmacist, or health care provider.  2018 Elsevier/Gold Standard (2016-01-17 12:58:03)  

## 2017-01-23 ENCOUNTER — Ambulatory Visit: Payer: 59 | Admitting: Physical Therapy

## 2017-01-28 ENCOUNTER — Ambulatory Visit (HOSPITAL_COMMUNITY)
Admission: RE | Admit: 2017-01-28 | Discharge: 2017-01-28 | Disposition: A | Discharge status: Home / Self Care | Payer: 59 | Source: Ambulatory Visit | Attending: Hematology | Admitting: Hematology

## 2017-01-28 DIAGNOSIS — C8110 Nodular sclerosis classical Hodgkin lymphoma, unspecified site: Secondary | ICD-10-CM | POA: Insufficient documentation

## 2017-01-28 DIAGNOSIS — C8190 Hodgkin lymphoma, unspecified, unspecified site: Secondary | ICD-10-CM | POA: Diagnosis not present

## 2017-01-28 LAB — GLUCOSE, CAPILLARY: GLUCOSE-CAPILLARY: 115 mg/dL — AB (ref 65–99)

## 2017-01-28 MED ORDER — FLUDEOXYGLUCOSE F - 18 (FDG) INJECTION
9.9000 | Freq: Once | INTRAVENOUS | Status: AC | PRN
  Administered 2017-01-28: 9.9 via INTRAVENOUS

## 2017-01-29 ENCOUNTER — Encounter: Payer: 59 | Admitting: Physical Therapy

## 2017-01-30 ENCOUNTER — Encounter: Payer: 59 | Admitting: Physical Therapy

## 2017-02-04 ENCOUNTER — Encounter: Payer: 59 | Admitting: Physical Therapy

## 2017-02-06 ENCOUNTER — Other Ambulatory Visit (HOSPITAL_BASED_OUTPATIENT_CLINIC_OR_DEPARTMENT_OTHER): Payer: 59

## 2017-02-06 ENCOUNTER — Telehealth: Payer: Self-pay

## 2017-02-06 ENCOUNTER — Encounter: Payer: Self-pay | Admitting: Hematology

## 2017-02-06 ENCOUNTER — Inpatient Hospital Stay: Payer: 59 | Attending: Hematology | Admitting: Hematology

## 2017-02-06 ENCOUNTER — Other Ambulatory Visit: Payer: Self-pay | Admitting: Radiology

## 2017-02-06 ENCOUNTER — Ambulatory Visit: Payer: 59 | Admitting: Hematology

## 2017-02-06 ENCOUNTER — Encounter: Payer: 59 | Admitting: Physical Therapy

## 2017-02-06 VITALS — BP 127/72 | HR 79 | Temp 97.9°F | Resp 18 | Ht 69.0 in | Wt 204.4 lb

## 2017-02-06 DIAGNOSIS — C8148 Lymphocyte-rich classical Hodgkin lymphoma, lymph nodes of multiple sites: Secondary | ICD-10-CM | POA: Diagnosis not present

## 2017-02-06 DIAGNOSIS — C8110 Nodular sclerosis classical Hodgkin lymphoma, unspecified site: Secondary | ICD-10-CM | POA: Diagnosis not present

## 2017-02-06 DIAGNOSIS — G629 Polyneuropathy, unspecified: Secondary | ICD-10-CM | POA: Diagnosis not present

## 2017-02-06 DIAGNOSIS — Z23 Encounter for immunization: Secondary | ICD-10-CM

## 2017-02-06 DIAGNOSIS — Z95828 Presence of other vascular implants and grafts: Secondary | ICD-10-CM

## 2017-02-06 LAB — CBC & DIFF AND RETIC
BASO%: 0.4 % (ref 0.0–2.0)
Basophils Absolute: 0 10*3/uL (ref 0.0–0.1)
EOS%: 0.6 % (ref 0.0–7.0)
Eosinophils Absolute: 0 10*3/uL (ref 0.0–0.5)
HCT: 38.2 % (ref 34.8–46.6)
HGB: 11.9 g/dL (ref 11.6–15.9)
Immature Retic Fract: 11.4 % — ABNORMAL HIGH (ref 1.60–10.00)
LYMPH%: 22.4 % (ref 14.0–49.7)
MCH: 32.4 pg (ref 25.1–34.0)
MCHC: 31.2 g/dL — ABNORMAL LOW (ref 31.5–36.0)
MCV: 104.1 fL — ABNORMAL HIGH (ref 79.5–101.0)
MONO#: 0.5 10*3/uL (ref 0.1–0.9)
MONO%: 7.9 % (ref 0.0–14.0)
NEUT#: 4.6 10*3/uL (ref 1.5–6.5)
NEUT%: 68.7 % (ref 38.4–76.8)
Platelets: 287 10*3/uL (ref 145–400)
RBC: 3.67 10*6/uL — ABNORMAL LOW (ref 3.70–5.45)
RDW: 15.4 % — ABNORMAL HIGH (ref 11.2–14.5)
Retic %: 3.92 % — ABNORMAL HIGH (ref 0.70–2.10)
Retic Ct Abs: 143.86 10*3/uL — ABNORMAL HIGH (ref 33.70–90.70)
WBC: 6.8 10*3/uL (ref 3.9–10.3)
lymph#: 1.5 10*3/uL (ref 0.9–3.3)

## 2017-02-06 LAB — COMPREHENSIVE METABOLIC PANEL
ALT: 56 U/L — AB (ref 0–55)
ANION GAP: 8 meq/L (ref 3–11)
AST: 39 U/L — ABNORMAL HIGH (ref 5–34)
Albumin: 4.1 g/dL (ref 3.5–5.0)
Alkaline Phosphatase: 105 U/L (ref 40–150)
BUN: 9.8 mg/dL (ref 7.0–26.0)
CALCIUM: 9.5 mg/dL (ref 8.4–10.4)
CHLORIDE: 106 meq/L (ref 98–109)
CO2: 26 mEq/L (ref 22–29)
CREATININE: 1 mg/dL (ref 0.6–1.1)
EGFR: >60 mL/min/{1.73_m2} (ref >60)
Glucose: 101 mg/dl (ref 70–140)
Potassium: 3.9 mEq/L (ref 3.5–5.1)
Sodium: 140 mEq/L (ref 136–145)
Total Bilirubin: 0.22 mg/dL (ref 0.20–1.20)
Total Protein: 6.9 g/dL (ref 6.4–8.3)

## 2017-02-06 MED ORDER — PNEUMOCOCCAL 13-VAL CONJ VACC IM SUSP
0.5000 mL | Freq: Once | INTRAMUSCULAR | Status: AC
  Administered 2017-02-06: 0.5 mL via INTRAMUSCULAR
  Filled 2017-02-06: qty 0.5

## 2017-02-06 NOTE — Progress Notes (Signed)
[facility]    HEMATOLOGY/ONCOLOGY CLINIC NOTE  Date of Service: 02/06/2017   Patient Care Team: Donald Prose, MD as PCP - General (Family Medicine)  CHIEF COMPLAINTS/PURPOSE OF CONSULTATION:  F/u for management of Hodgkins lymphoma  HISTORY OF PRESENTING ILLNESS:   Rhonda Mccann is a wonderful 47 y.o. female who has been referred to [place] by Dr .Donald Prose, MD for evaluation and management of newly diagnosed Hodgkins Lymphoma.  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  Patient first reported noticing a lump in the left side of her neck in January 2018. She noted some mild progressive increase in this lump which appeared more prominent and she reported it to her primary care physician. Patient had an ultrasound of her neck on 07/07/2016 which showed a 1.9 x 0.8 x 1.4 cm lymph node with no distinct fatty hilum. This was subsequently biopsied under ultrasound guidance on 07/21/2016 and findings are consistent with classical Hodgkin's lymphoma nodular sclerosis subtype.  Patient notes no other overtly palpable lymphadenopathy noted. No fevers no chills no night sweats no unexpected weight loss. She reports no lethargy no anorexia. Overall feels quite well.  INTERVAL HISTORY  Pt presents for follow-up accompanied by her husband and father after completion of her planned 6 cycles of chemotherapy for CHL. Of note since her last visit, she had a PET scan done on 01/28/2017 with results showing: IMPRESSION: 1. No evidence of residual metabolic active lymphoma ( Deauville 1). 2. Soft tissue thickening in the anterior mediastinum at site of prior metabolic adenopathy consists with treated adenopathy and residual thymus. 3. Diffuse marrow metabolic activity consistent with GCSF type response.   Since last being seen, she has overall been doing well. She is accompanied by her brother and  husband today. She has returned to work recently and states she's been doing great. Labs are stable. She is now in complete remission. She states neuropathy has worsened to the distal portion of her plantar feet since starting work but is tolerating Cymbalta well for this.  NO feves/chills/night sweats.  Spent significant time going over survivorship followup planning. Informed consent obtained to give her Prevnar vaccine today.  On review of systems, pt reports grade 1-2 numbness accompanied by electric-like pain to her legs and feet and denies fever, chills, night sweats and any other accompanying symptoms.   MEDICAL HISTORY:  Past Medical History:  Diagnosis Date  . Depression   . Drug abuse ([place]) 2001  EX alcohol abuse sober for 17 years  Donated left kidney in 2013 (single kidney) Scoliosis GERD  SURGICAL HISTORY: Past Surgical History:  Procedure Laterality Date  . IR FLUORO GUIDE PORT INSERTION RIGHT  08/05/2016  . IR US GUIDE VASC ACCESS RIGHT  08/05/2016  . NEPHRECTOMY Left 2015  . NEPHRECTOMY LIVING DONOR  2013  Breast Augmentation Surgery in 1994 (saline implants)  SOCIAL HISTORY: Social History   Socioeconomic History  . Marital status: Married    Spouse name: Not on file  . Number of children: Not on file  . Years of education: Not on file  . Highest education level: Not on file  Social Needs  . Financial resource strain: Not on file  . Food insecurity - worry: Not on file  . Food insecurity - inability: Not on file  . Transportation needs - medical: Not on file  . Transportation needs - non-medical: Not on file  Occupational History  . Not on file  Tobacco Use  . Smoking status: Former Smoker    Types: Cigarettes    Last attempt to quit: 1998    Years since quitting: 21.0  . Smokeless tobacco: Never Used  . Tobacco comment: quit 17 yrs ago  Substance and Sexual Activity  . Alcohol use: No  . Drug use: No    Comment: Previous drug user - quite 2001  .  Sexual activity: Yes  Other Topics Concern  . Not on file  Social History Narrative  . Not on file  Works as a Production manager   FAMILY HISTORY: Paternal aunt had breast cancer Paternal grandfather had lymphoma  ALLERGIES:  is allergic to contrast media [iodinated diagnostic agents] and nsaids.  MEDICATIONS:  Current Outpatient Medications  Medication Sig Dispense Refill  . DULoxetine (CYMBALTA) 60 MG capsule Take 60 mg by mouth daily.    [facility] LORazepam (ATIVAN) 0.5 MG tablet Take 1 tablet (0.5 mg total) every 8 (eight) hours by mouth. 30 tablet 0  . omeprazole (PRILOSEC) 20 MG capsule Take 20 mg by mouth daily.    . ondansetron (ZOFRAN) 8 MG tablet Take 1 tablet (8 mg total) by mouth every 8 (eight) hours as needed for nausea or vomiting. 30 tablet 1  . prochlorperazine (COMPAZINE) 10 MG tablet Take 1 tablet (10 mg total) by mouth every 6 (six) hours as needed for nausea or vomiting. 30 tablet 0  . traZODone (DESYREL) 100 MG tablet Take 1 tablet (100 mg total) by mouth at bedtime as needed for sleep. 30 tablet 1   No current facility-administered medications for this visit.     REVIEW OF SYSTEMS:    10 Point review of Systems was done is negative except as noted above.  PHYSICAL EXAMINATION: ECOG PERFORMANCE STATUS: 0 - Asymptomatic  .VS as per EPIC GENERAL:alert, in no acute distress and comfortable SKIN: no acute rashes, no significant lesions EYES: conjunctiva are pink and non-injected, sclera anicteric OROPHARYNX: MMM, no exudates, no oropharyngeal erythema or ulceration NECK: supple, no JVD LYMPH: no palpable lymphadenopathy in the cervical,axillary or inguinal regions. Previous small cervical LN not palpable at this time. LUNGS: clear to auscultation b/l with normal respiratory effort HEART: regular rate & rhythm ABDOMEN:  normoactive bowel sounds , non tender, not distended. Extremity: no pedal edema PSYCH: alert & oriented x 3 with fluent speech NEURO: no focal  motor/sensory deficits  LABORATORY DATA:  I have reviewed the data as listed  . CBC Latest Ref Rng & Units 02/06/2017 01/19/2017 01/07/2017  WBC 3.9 - 10.3 10e3/uL 6.8 10.6(H) 8.4  Hemoglobin 11.6 - 15.9 g/dL 11.9 10.6(L) 10.7(L)  Hematocrit 34.8 - 46.6 % 38.2 34.2(L) 34.7(L)  Platelets 145 - 400 10e3/uL 287 259 226   ANC 700 . CMP Latest Ref Rng & Units 02/06/2017 01/19/2017 01/07/2017  Glucose 70 - 140 mg/dl 101 129 128  BUN 7.0 - 26.0 mg/dL 9.8 7.0 7.2  Creatinine 0.6 - 1.1 mg/dL 1.0 0.9 0.9  Sodium 136 - 145 mEq/L 140 141 141  Potassium 3.5 - 5.1 mEq/L 3.9 3.6 3.4(L)  CO2 22 - 29 mEq/L 26 24 22   Calcium 8.4 - 10.4 mg/dL 9.5 9.1 9.1  Total Protein 6.4 - 8.3 g/dL 6.9 6.6 6.4  Total Bilirubin 0.20 - 1.20 mg/dL 0.22 0.23 0.24  Alkaline Phos 40 - 150 U/L 105 113 91  AST 5 - 34 U/L 39(H) 24 29  ALT 0 - 55 U/L 56(H) 46 49  RADIOGRAPHIC STUDIES: I have personally reviewed the radiological images as listed and agreed with the findings in the report. Nm Pet Image Restag (ps) Skull Base To Thigh  Result Date: 01/28/2017 CLINICAL DATA:  Subsequent treatment strategy for Hodgkin's lymphoma. EXAM: NUCLEAR MEDICINE PET SKULL BASE TO THIGH TECHNIQUE: 9.9 mCi F-18 FDG was injected intravenously. Full-ring PET imaging was performed from the skull base to thigh after the radiotracer. CT data was obtained and used for attenuation correction and anatomic localization. FASTING BLOOD GLUCOSE:  Value: 115 mg/dl COMPARISON:  PET-CT 10/07/2016 FINDINGS: NECK No hypermetabolic lymph nodes in the neck. No enlarged cervical lymph nodes. physiologic activity at the glottis. CHEST No hypermetabolic mediastinal or hilar nodes. No suspicious pulmonary nodules on the CT scan. Soft tissue thickening in the anterior mediastinum without metabolic activity at site of prior enlarged hypermetabolic lymph nodes. The tissue likely represents residual thymus and small non metabolically active lymph nodes.  ABDOMEN/PELVIS No abnormal hypermetabolic activity within the liver, pancreas, adrenal glands, or spleen. No hypermetabolic lymph nodes in the abdomen or pelvis. Post LEFT nephrectomy. RIGHT kidney appears normal uterus and ovaries normal. SKELETON Diffuse increased marrow metabolic activity consistent GCSF type response. IMPRESSION: 1. No evidence of residual metabolic active lymphoma ( Deauville 1). 2. Soft tissue thickening in the anterior mediastinum at site of prior metabolic adenopathy consists with treated adenopathy and residual thymus. 3. Diffuse marrow metabolic activity consistent with GCSF type response. Electronically Signed   By: Suzy Bouchard M.D.   On: 01/28/2017 11:17    ASSESSMENT & PLAN:   47 yo very pleasant caucasian female with   1) Stage IIA (adverse risk due to >3 regions of involvement, non bulky) Classical Hodgkins Lymphoma -Nodular Sclerosis Subtype No constitutional symptoms. Sed rate 18 (WNL) PET/CT reviewed ECHO EF 60-65% DLCO uncorrected 70% of predicted. 24h creatinine clearance WNL.  Patient is s/p 6 cycles of rx -PET from 10/07/2016 reviewed. Near complete metabolic response to therapy and no residual disease > Deauville 2  -PET from 01/28/2017 reviewed. Response to therapy and no residual disease- in remission at this time  PLAN Labs stable -discussed and reviewed PET/CT in details. patient is understandably glad that her CHL is in CR currently. -we discussed the NCCN surveillance and survivorship guidelines and plans for monitoring and followup.  6) Insomnia - she has trouble sleeping some nights -trazodone prn   7) Grade 1 neuropathy from ctx. Will monitor on treatment. Partly controlled with Cymbalta Plan -continue Cymbalta to 60mg  po daily (for her hot flashes/neuropathy and depression).  8) Single kidney - patient donated left kidney in 2013 -avoid nephrotoxic medications -Creatinine stable   -Prevnar vaccine today  followed by  pneumococcal vaccine on next clinic visit -Recommend keeping physically active following remission  - IR for port-a-cath removal ASAP (morning Appointment)  RTC with Dr Irene Limbo in 3 months with labs     All of the patients questions were answered with apparent satisfaction. The patient knows to call the clinic with any problems, questions or concerns.  I spent 20 minutes counseling the patient face to face. The total time spent in the appointment was 25 minutes and more than 50% was on counseling and direct patient cares.  .I have reviewed the above documentation for accuracy and completeness, and I agree with the above.  Sullivan Lone MD [place] AAHIVMS [facility] Hematology/Oncology Physician [facility]  (Office):       [phone] (Work cell):  [phone] (Fax):  [phone]  This document serves as a record of services personally performed by Sullivan Lone, MD. It was created on his behalf by Alean Rinne, a trained medical scribe. The creation of this record is based on the scribe's personal observations and the provider's statements to them.   .I have reviewed the above documentation for accuracy and completeness, and I agree with the above. Brunetta Genera MD MS

## 2017-02-06 NOTE — Telephone Encounter (Signed)
Pt to expect phone call from IR to schedule port removal within the next two weeks. Pt verbalized understanding. Spoke with Juliann Pulse in IR and she said their scheduler would be in touch with the patient.

## 2017-02-06 NOTE — Patient Instructions (Signed)
Thank you for choosing [facility] to provide your oncology and hematology care.  To afford each patient quality time with our providers, please arrive 30 minutes before your scheduled appointment time.  If you arrive late for your appointment, you may be asked to reschedule.  We strive to give you quality time with our providers, and arriving late affects you and other patients whose appointments are after yours.  If you are a no show for multiple scheduled visits, you may be dismissed from the clinic at the providers discretion.   Again, thank you for choosing [facility], our hope is that these requests will decrease the amount of time that you wait before being seen by our physicians.  ______________________________________________________________________ Should you have questions after your visit to the [facility], please contact our office at [phone] between the hours of 8:30 and 4:30 p.m.    Voicemails left after 4:30p.m will not be returned until the following business day.   For prescription refill requests, please have your pharmacy contact us directly.  Please also try to allow 48 hours for prescription requests.   Please contact the scheduling department for questions regarding scheduling.  For scheduling of procedures such as PET scans, CT scans, MRI, Ultrasound, etc please contact central scheduling at [phone].   Resources For Cancer Patients and Caregivers:  American Cancer Society:  800-227-2345  Can help patients locate various types of support and financial assistance Cancer Care: 1-800-813-HOPE (4673) Provides financial assistance, online support groups, medication/co-pay assistance.   [place] DSS:  336-641-3447 Where to apply for food stamps, Medicaid, and utility assistance Medicare Rights Center: 800-333-4114 Helps people with Medicare understand their rights and benefits, navigate the Medicare system, and secure the  quality healthcare they deserve SCAT: 336-333-6589 [employer] [employer]'s shared-ride transportation service for eligible riders who have a disability that prevents them from riding the fixed route bus.   For additional information on assistance programs please contact our social worker:   Grier Hock/Abigail Elmore:  [phone] 

## 2017-02-07 LAB — SEDIMENTATION RATE: Sedimentation Rate-Westergren: 11 mm/hr (ref 0–32)

## 2017-02-09 ENCOUNTER — Encounter (HOSPITAL_COMMUNITY): Payer: Self-pay

## 2017-02-09 ENCOUNTER — Ambulatory Visit (HOSPITAL_COMMUNITY)
Admission: RE | Admit: 2017-02-09 | Discharge: 2017-02-09 | Disposition: A | Discharge status: Home / Self Care | Payer: 59 | Source: Ambulatory Visit | Attending: Hematology | Admitting: Hematology

## 2017-02-09 DIAGNOSIS — Z905 Acquired absence of kidney: Secondary | ICD-10-CM | POA: Insufficient documentation

## 2017-02-09 DIAGNOSIS — F329 Major depressive disorder, single episode, unspecified: Secondary | ICD-10-CM | POA: Insufficient documentation

## 2017-02-09 DIAGNOSIS — Z8571 Personal history of Hodgkin lymphoma: Secondary | ICD-10-CM | POA: Insufficient documentation

## 2017-02-09 DIAGNOSIS — Z79899 Other long term (current) drug therapy: Secondary | ICD-10-CM | POA: Diagnosis not present

## 2017-02-09 DIAGNOSIS — Z452 Encounter for adjustment and management of vascular access device: Secondary | ICD-10-CM | POA: Diagnosis not present

## 2017-02-09 DIAGNOSIS — Z9221 Personal history of antineoplastic chemotherapy: Secondary | ICD-10-CM | POA: Diagnosis not present

## 2017-02-09 DIAGNOSIS — Z8572 Personal history of non-Hodgkin lymphomas: Secondary | ICD-10-CM | POA: Diagnosis not present

## 2017-02-09 DIAGNOSIS — Z923 Personal history of irradiation: Secondary | ICD-10-CM | POA: Diagnosis not present

## 2017-02-09 DIAGNOSIS — Z95828 Presence of other vascular implants and grafts: Secondary | ICD-10-CM

## 2017-02-09 HISTORY — PX: IR REMOVAL TUN ACCESS W/ PORT W/O FL MOD SED: IMG2290

## 2017-02-09 LAB — CBC WITH DIFFERENTIAL/PLATELET
BASOS ABS: 0 10*3/uL (ref 0.0–0.1)
Basophils Relative: 1 %
EOS ABS: 0 10*3/uL (ref 0.0–0.7)
Eosinophils Relative: 1 %
HCT: 33.9 % — ABNORMAL LOW (ref 36.0–46.0)
Hemoglobin: 10.7 g/dL — ABNORMAL LOW (ref 12.0–15.0)
LYMPHS PCT: 21 %
Lymphs Abs: 1.1 10*3/uL (ref 0.7–4.0)
MCH: 32.1 pg (ref 26.0–34.0)
MCHC: 31.6 g/dL (ref 30.0–36.0)
MCV: 101.8 fL — ABNORMAL HIGH (ref 78.0–100.0)
Monocytes Absolute: 0.6 10*3/uL (ref 0.1–1.0)
Monocytes Relative: 12 %
Neutro Abs: 3.5 10*3/uL (ref 1.7–7.7)
Neutrophils Relative %: 65 %
Platelets: 305 10*3/uL (ref 150–400)
RBC: 3.33 MIL/uL — AB (ref 3.87–5.11)
RDW: 15.3 % (ref 11.5–15.5)
WBC: 5.3 10*3/uL (ref 4.0–10.5)

## 2017-02-09 LAB — PROTIME-INR
INR: 0.94
PROTHROMBIN TIME: 12.5 s (ref 11.4–15.2)

## 2017-02-09 MED ORDER — FENTANYL CITRATE (PF) 100 MCG/2ML IJ SOLN
INTRAMUSCULAR | Status: AC | PRN
  Administered 2017-02-09 (×2): 50 ug via INTRAVENOUS

## 2017-02-09 MED ORDER — CEFAZOLIN SODIUM-DEXTROSE 2-4 GM/100ML-% IV SOLN
2.0000 g | INTRAVENOUS | Status: AC
  Administered 2017-02-09: 2 g via INTRAVENOUS

## 2017-02-09 MED ORDER — SODIUM CHLORIDE 0.9 % IV SOLN
INTRAVENOUS | Status: DC
  Administered 2017-02-09: 08:00:00 via INTRAVENOUS

## 2017-02-09 MED ORDER — MIDAZOLAM HCL 2 MG/2ML IJ SOLN
INTRAMUSCULAR | Status: AC | PRN
  Administered 2017-02-09 (×2): 1 mg via INTRAVENOUS

## 2017-02-09 MED ORDER — LIDOCAINE-EPINEPHRINE (PF) 2 %-1:200000 IJ SOLN
INTRAMUSCULAR | Status: AC | PRN
  Administered 2017-02-09: 20 mL

## 2017-02-09 MED ORDER — FENTANYL CITRATE (PF) 100 MCG/2ML IJ SOLN
INTRAMUSCULAR | Status: AC
  Filled 2017-02-09: qty 2

## 2017-02-09 MED ORDER — LIDOCAINE-EPINEPHRINE (PF) 2 %-1:200000 IJ SOLN
INTRAMUSCULAR | Status: AC
  Filled 2017-02-09: qty 20

## 2017-02-09 MED ORDER — CEFAZOLIN SODIUM-DEXTROSE 2-4 GM/100ML-% IV SOLN
INTRAVENOUS | Status: AC
  Administered 2017-02-09: 2 g via INTRAVENOUS
  Filled 2017-02-09: qty 100

## 2017-02-09 MED ORDER — MIDAZOLAM HCL 2 MG/2ML IJ SOLN
INTRAMUSCULAR | Status: AC
  Filled 2017-02-09: qty 2

## 2017-02-09 NOTE — Discharge Instructions (Signed)
Moderate Conscious Sedation, Adult, Care After °These instructions provide you with information about caring for yourself after your procedure. Your health care provider may also give you more specific instructions. Your treatment has been planned according to current medical practices, but problems sometimes occur. Call your health care provider if you have any problems or questions after your procedure. °What can I expect after the procedure? °After your procedure, it is common: °· To feel sleepy for several hours. °· To feel clumsy and have poor balance for several hours. °· To have poor judgment for several hours. °· To vomit if you eat too soon. ° °Follow these instructions at home: °For at least 24 hours after the procedure: ° °· Do not: °? Participate in activities where you could fall or become injured. °? Drive. °? Use heavy machinery. °? Drink alcohol. °? Take sleeping pills or medicines that cause drowsiness. °? Make important decisions or sign legal documents. °? Take care of children on your own. °· Rest. °Eating and drinking °· Follow the diet recommended by your health care provider. °· If you vomit: °? Drink water, juice, or soup when you can drink without vomiting. °? Make sure you have little or no nausea before eating solid foods. °General instructions °· Have a responsible adult stay with you until you are awake and alert. °· Take over-the-counter and prescription medicines only as told by your health care provider. °· If you smoke, do not smoke without supervision. °· Keep all follow-up visits as told by your health care provider. This is important. °Contact a health care provider if: °· You keep feeling nauseous or you keep vomiting. °· You feel light-headed. °· You develop a rash. °· You have a fever. °Get help right away if: °· You have trouble breathing. °This information is not intended to replace advice given to you by your health care provider. Make sure you discuss any questions you have  with your health care provider. °Document Released: 11/10/2012 Document Revised: 06/25/2015 Document Reviewed: 05/12/2015 °Elsevier Interactive Patient Education © 2018 Elsevier Inc. ° ° °Implanted Port Removal, Care After °Refer to this sheet in the next few weeks. These instructions provide you with information about caring for yourself after your procedure. Your health care provider may also give you more specific instructions. Your treatment has been planned according to current medical practices, but problems sometimes occur. Call your health care provider if you have any problems or questions after your procedure. °What can I expect after the procedure? °After the procedure, it is common to have: °· Soreness or pain near your incision. °· Some swelling or bruising near your incision. ° °Follow these instructions at home: °Medicines °· Take over-the-counter and prescription medicines only as told by your health care provider. °· If you were prescribed an antibiotic medicine, take it as told by your health care provider. Do not stop taking the antibiotic even if you start to feel better. °Bathing °· Do not take baths, swim, or use a hot tub until your health care provider approves. Ask your health care provider if you can take showers. You may only be allowed to take sponge baths for bathing.  You may shower tomorrow. °Incision care °· Follow instructions from your health care provider about how to take care of your incision. Make sure you: °? Wash your hands with soap and water before you change your bandage (dressing). If soap and water are not available, use hand sanitizer. °? Change your dressing as told by your   health care provider.  You may remove your dressing tomorrow. °? Keep your dressing dry. °? Leave stitches (sutures), skin glue, or adhesive strips in place. These skin closures may need to stay in place for 2 weeks or longer. If adhesive strip edges start to loosen and curl up, you may trim the  loose edges. Do not remove adhesive strips completely unless your health care provider tells you to do that. °· Check your incision area every day for signs of infection. Check for: °? More redness, swelling, or pain. °? More fluid or blood. °? Warmth. °? Pus or a bad smell. °Driving °· If you received a sedative, do not drive for 24 hours after the procedure. °· If you did not receive a sedative, ask your health care provider when it is safe to drive. °Activity °· Return to your normal activities as told by your health care provider. Ask your health care provider what activities are safe for you. °· Until your health care provider says it is safe: °? Do not lift anything that is heavier than 10 lb (4.5 kg). °? Do not do activities that involve lifting your arms over your head. °General instructions °· Do not use any tobacco products, such as cigarettes, chewing tobacco, and e-cigarettes. Tobacco can delay healing. If you need help quitting, ask your health care provider. °· Keep all follow-up visits as told by your health care provider. This is important. °Contact a health care provider if: °· You have more redness, swelling, or pain around your incision. °· You have more fluid or blood coming from your incision. °· Your incision feels warm to the touch. °· You have pus or a bad smell coming from your incision. °· You have a fever. °· You have pain that is not relieved by your pain medicine. °Get help right away if: °· You have chest pain. °· You have difficulty breathing. °This information is not intended to replace advice given to you by your health care provider. Make sure you discuss any questions you have with your health care provider. °Document Released: 01/01/2015 Document Revised: 06/28/2015 Document Reviewed: 10/25/2014 °Elsevier Interactive Patient Education © 2018 Elsevier Inc. ° °

## 2017-02-09 NOTE — Procedures (Signed)
Pre Procedural Dx: Poor venous access Post Procedural Dx: Same  Successful removal of anterior chest wall port-a-cath.  EBL: Minimal  No immediate post procedural complications.   Jay Ieisha Gao, MD Pager #: [phone]   

## 2017-02-09 NOTE — H&P (Signed)
Referring Physician(s): Brunetta Genera  Supervising Physician: Sandi Mariscal  Patient Status:  [facility]  Chief Complaint:  "I'm getting my port out"  Subjective: Patient familiar to IR service from prior left neck lymph node biopsy in June of last year as well as Port-A-Cath placement in September of last year.  She has a history of Hodgkin's lymphoma and is status post chemoradiation, currently in clinical remission.  She presents today for Port-A-Cath removal.  She currently denies fever, headache, chest pain, dyspnea, cough, abdominal/back pain, nausea, vomiting or bleeding. Past Medical History:  Diagnosis Date  . Depression   . Drug abuse ([place]) 2001   Past Surgical History:  Procedure Laterality Date  . IR FLUORO GUIDE PORT INSERTION RIGHT  08/05/2016  . IR US GUIDE VASC ACCESS RIGHT  08/05/2016  . NEPHRECTOMY Left 2015  . NEPHRECTOMY LIVING DONOR  2013      Allergies: Contrast media [iodinated diagnostic agents] and Nsaids  Medications: Prior to Admission medications   Medication Sig Start Date End Date Taking? Authorizing Provider  DULoxetine (CYMBALTA) 60 MG capsule Take 60 mg by mouth daily.   Yes [provider]  LORazepam (ATIVAN) 0.5 MG tablet Take 1 tablet (0.5 mg total) every 8 (eight) hours by mouth. 12/09/16  Yes Brunetta Genera, MD  omeprazole (PRILOSEC) 20 MG capsule Take 20 mg by mouth daily.   Yes [provider]  ondansetron (ZOFRAN) 8 MG tablet Take 1 tablet (8 mg total) by mouth every 8 (eight) hours as needed for nausea or vomiting. 11/25/16  Yes Brunetta Genera, MD  prochlorperazine (COMPAZINE) 10 MG tablet Take 1 tablet (10 mg total) by mouth every 6 (six) hours as needed for nausea or vomiting. 10/01/16  Yes Brunetta Genera, MD  traZODone (DESYREL) 100 MG tablet Take 1 tablet (100 mg total) by mouth at bedtime as needed for sleep. 01/07/17  Yes Brunetta Genera, MD     Vital Signs: Blood pressure 116/75, heart  rate 78, respirations 16, temperature 98.6, O2 sat 98% room air    Physical Exam awake, alert.  Chest clear to auscultation bilaterally.  Clean, intact right chest wall Port-A-Cath.  Heart with regular rate and rhythm.  Abdomen soft, positive bowel sounds, nontender.  No lower extremity edema.  Imaging: No results found.  Labs:  CBC: Recent Labs    01/07/17 0804 01/19/17 0751 02/06/17 0816 02/09/17 0805  WBC 8.4 10.6* 6.8 5.3  HGB 10.7* 10.6* 11.9 10.7*  HCT 34.7* 34.2* 38.2 33.9*  PLT 226 259 287 305    COAGS: Recent Labs    08/05/16 0939  INR 0.96    BMP: Recent Labs    08/15/16 0832  12/23/16 0853 01/07/17 0804 01/19/17 0751 02/06/17 0816  NA  --    < > 140 141 141 140  K  --    < > 3.6 3.4* 3.6 3.9  CO2  --    < > 23 22 24 26   GLUCOSE  --    < > 119 128 129 101  BUN  --    < > 6.0* 7.2 7.0 9.8  CALCIUM  --    < > 9.1 9.1 9.1 9.5  CREATININE 1.00   < > 0.9 0.9 0.9 1.0  GFRNONAA 68  --   --   --   --   --   GFRAA 79  --   --   --   --   --    < > =  values in this interval not displayed.    LIVER FUNCTION TESTS: Recent Labs    12/23/16 0853 01/07/17 0804 01/19/17 0751 02/06/17 0816  BILITOT 0.22 0.24 0.23 0.22  AST 20 29 24  39*  ALT 35 49 46 56*  ALKPHOS 93 91 113 105  PROT 6.5 6.4 6.6 6.9  ALBUMIN 3.6 3.8 3.9 4.1    Assessment and Plan:  Pt with history of Hodgkin's lymphoma , status post chemoradiation, currently in clinical remission.  She presents today for Port-A-Cath removal.  Details/risks of procedure, including but not limited to, internal bleeding, infection, injury to adjacent structures, discussed with patient and family with their understanding and consent.   Electronically Signed: D. Rowe Robert, PA-C 02/09/2017, 8:31 AM   I spent a total of 20 minutes at the the patient's bedside AND on the patient's hospital floor or unit, greater than 50% of which was counseling/coordinating care for Port-A-Cath removal

## 2017-02-10 ENCOUNTER — Encounter: Payer: 59 | Admitting: Physical Therapy

## 2017-02-11 ENCOUNTER — Telehealth: Payer: Self-pay | Admitting: Hematology

## 2017-02-11 ENCOUNTER — Telehealth: Payer: Self-pay | Admitting: *Deleted

## 2017-02-11 NOTE — Telephone Encounter (Signed)
Spoke with patient re 4/10 lab/fu

## 2017-02-11 NOTE — Telephone Encounter (Signed)
Received a call from pt stating that she has returned to work and now has 2-3+ edema in BLE along with increased neuropathy.  She denied redness, warmth, or pain to any area.  Pt advised that edema is likely related to the fact that she is up and walking around more than she has been lately and advised her to keep her feet elevated when possible.  Pt verbalized understanding.  Message sent to Dr. Irene Limbo for any further instruction.

## 2017-02-27 ENCOUNTER — Other Ambulatory Visit: Payer: Self-pay | Admitting: Hematology

## 2017-02-28 ENCOUNTER — Other Ambulatory Visit: Payer: Self-pay | Admitting: Hematology

## 2017-03-06 ENCOUNTER — Telehealth: Payer: Self-pay

## 2017-03-06 ENCOUNTER — Other Ambulatory Visit: Payer: Self-pay

## 2017-03-06 MED ORDER — DULOXETINE HCL 60 MG PO CPEP: 60.0000 mg | ORAL_CAPSULE | Freq: Every day | ORAL | 0 refills | Status: DC

## 2017-03-06 NOTE — Telephone Encounter (Signed)
Called pt to confirm that she is no longer taking cymbalta and does not need a refill for this prescription that was requested by her pharmacy. Left VM 03/05/17 at 0951. No return calls from the patient at this time.

## 2017-03-06 NOTE — Telephone Encounter (Signed)
Pt called and confirmed that she is still taking cymbalta. Medication refilled and [facility] on [address]. Pt requested potentially reordering tramadol 50mg  prn. Will discuss with Dr. Irene Limbo on Monday. Pt aware and in agreement with plan.

## 2017-03-09 ENCOUNTER — Other Ambulatory Visit: Payer: Self-pay | Admitting: Hematology

## 2017-03-09 ENCOUNTER — Other Ambulatory Visit: Payer: Self-pay

## 2017-03-09 ENCOUNTER — Telehealth: Payer: Self-pay

## 2017-03-09 MED ORDER — TRAMADOL HCL 50 MG PO TABS: 50.0000 mg | ORAL_TABLET | ORAL | 0 refills | Status: DC

## 2017-03-09 MED ORDER — TRAMADOL HCL 50 MG PO TABS
50.0000 mg | ORAL_TABLET | ORAL | 0 refills | Status: DC
Start: 2017-03-09 — End: 2017-05-13

## 2017-03-09 NOTE — Telephone Encounter (Signed)
Called pt 1123 on 03/09/17. Left VM explaining to pt that per Dr. Irene Limbo okay to take tramadol 50mg  prn every other day, but to be aware that this can cause serotonin syndrome when used in conjunction with cymbalta. Meaning pt may experience diaphoresis and hyperactive when using both medications. [facility] [address].

## 2017-04-05 ENCOUNTER — Other Ambulatory Visit: Payer: Self-pay | Admitting: Hematology

## 2017-05-02 ENCOUNTER — Other Ambulatory Visit: Payer: Self-pay | Admitting: Hematology

## 2017-05-04 ENCOUNTER — Other Ambulatory Visit: Payer: Self-pay

## 2017-05-04 MED ORDER — TRAZODONE HCL 100 MG PO TABS: 100.0000 mg | ORAL_TABLET | Freq: Every evening | ORAL | 1 refills | Status: DC | PRN

## 2017-05-09 ENCOUNTER — Other Ambulatory Visit: Payer: Self-pay | Admitting: Hematology

## 2017-05-11 NOTE — Progress Notes (Signed)
[facility]    HEMATOLOGY/ONCOLOGY CLINIC NOTE  Date of Service: 05/13/17   Patient Care Team: Donald Prose, MD as PCP - General (Family Medicine)  CHIEF COMPLAINTS/PURPOSE OF CONSULTATION:  F/u for management of Hodgkins lymphoma  HISTORY OF PRESENTING ILLNESS:   Rhonda Mccann is a wonderful 47 y.o. female who has been referred to [place] by Dr .Donald Prose, MD for evaluation and management of newly diagnosed Hodgkins Lymphoma.  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  Patient first reported noticing a lump in the left side of her neck in January 2018. She noted some mild progressive increase in this lump which appeared more prominent and she reported it to her primary care physician. Patient had an ultrasound of her neck on 07/07/2016 which showed a 1.9 x 0.8 x 1.4 cm lymph node with no distinct fatty hilum. This was subsequently biopsied under ultrasound guidance on 07/21/2016 and findings are consistent with classical Hodgkin's lymphoma nodular sclerosis subtype.  Patient notes no other overtly palpable lymphadenopathy noted. No fevers no chills no night sweats no unexpected weight loss. She reports no lethargy no anorexia. Overall feels quite well.  INTERVAL HISTORY  Rhonda Mccann presents for follow-up after completion of her planned 6 cycles of chemotherapy for CHL. The patient's last visit with [place] was on 02/06/17. The pt reports that she is doing well overall and has enjoyed mountain biking recently.   The pt reports that she is continuing on Cymbalta 60 units and notes that on both feet she has tingling and numbness. She denies any cramping or associated pain. She notes that her temperature sensation in her feet has been "a little off" too. She denies any degenerative disk disease or any radiculopathy.  She has tried sleeping in compression socks without relief. She  notes that the [place] has helped alleviate her hot flashes.  She has stopped taking Trazodone 177m since her last visit. She also notes that she is emotionally doing well.   Lab results today (05/13/17) of CBC, CMP, and Reticulocytes is as follows: all values are WNL except for Alk Phos at 160, Total Bilirubin at <0.2.  On review of systems, pt reports good energy levels, numbness and tingling in her feet, and denies fevers, chills, night sweats, unexpected weight loss, new bone pains, abdominal pains, mouth sores, abdominal pains, bowel irregularities, and any other symptoms.   MEDICAL HISTORY:  Past Medical History:  Diagnosis Date  . Depression   . Drug abuse ([place] 2001  EX alcohol abuse sober for 17 years  Donated left kidney in 2013 (single kidney) Scoliosis GERD  SURGICAL HISTORY: Past Surgical History:  Procedure Laterality Date  . IR FLUORO GUIDE PORT INSERTION RIGHT  08/05/2016  . IR REMOVAL TUN ACCESS W/ PORT W/O FL MOD SED  02/09/2017  . IR [place] V[place] ACC[place] RIGHT  08/05/2016  . NEPHRECTOMY Left 2015  . NEPHRECTOMY LIVING DONOR  2013  Breast Augmentation Surgery in 1994 (saline implants)  SOCIAL HISTORY: Social History   Socioeconomic History  . Marital status: Married    Spouse name: Not on file  . Number of children: Not on file  . Years of education: Not on file  . Highest education level: Not on file  Occupational History  . Not on file  Social Needs  . Financial resource strain: Not on file  . Food insecurity:  Worry: Not on file    Inability: Not on file  . Transportation needs:    Medical: Not on file    Non-medical: Not on file  Tobacco Use  . Smoking status: Former Smoker    Types: Cigarettes    Last attempt to quit: 1998    Years since quitting: 21.2  . Smokeless tobacco: Never Used  . Tobacco comment: quit 17 yrs ago  Substance and Sexual Activity  . Alcohol use: No  . Drug use: No    Comment: Previous drug user - quite 2001  . Sexual  activity: Yes  Lifestyle  . Physical activity:    Days per week: Not on file    Minutes per session: Not on file  . Stress: Not on file  Relationships  . Social connections:    Talks on phone: Not on file    Gets together: Not on file    Attends religious service: Not on file    Active member of club or organization: Not on file    Attends meetings of clubs or organizations: Not on file    Relationship status: Not on file  . Intimate partner violence:    Fear of current or ex partner: Not on file    Emotionally abused: Not on file    Physically abused: Not on file    Forced sexual activity: Not on file  Other Topics Concern  . Not on file  Social History Narrative  . Not on file  Works as a Production manager   FAMILY HISTORY: Paternal aunt had breast cancer Paternal grandfather had lymphoma  ALLERGIES:  is allergic to contrast media [iodinated diagnostic agents] and nsaids.  MEDICATIONS:  Current Outpatient Medications  Medication Sig Dispense Refill  . DULoxetine (CYMBALTA) 60 MG capsule TAKE 1 CAPSULE BY MOUTH EVERY DAY 30 capsule 0  . omeprazole (PRILOSEC) 20 MG capsule Take 20 mg by mouth daily.    . traZODone (DESYREL) 100 MG tablet Take 1 tablet (100 mg total) by mouth at bedtime as needed for sleep. 30 tablet 1   No current facility-administered medications for this visit.     REVIEW OF SYSTEMS:    10 Point review of Systems was done is negative except as noted above.   PHYSICAL EXAMINATION: ECOG PERFORMANCE STATUS: 0 - Asymptomatic  VS as per Epic GENERAL:alert, in no acute distress and comfortable SKIN: no acute rashes, no significant lesions EYES: conjunctiva are pink and non-injected, sclera anicteric OROPHARYNX: MMM, no exudates, no oropharyngeal erythema or ulceration NECK: supple, no JVD LYMPH:  no palpable lymphadenopathy in the cervical, axillary or inguinal regions. Previous small cervical LN not palpable at this time.  LUNGS: clear to auscultation  b/l with normal respiratory effort HEART: regular rate & rhythm ABDOMEN:  normoactive bowel sounds , non tender, not distended. Extremity: no pedal edema PSYCH: alert & oriented x 3 with fluent speech NEURO: no focal motor/sensory deficits   LABORATORY DATA:  I have reviewed the data as listed  . CBC Latest Ref Rng & Units 05/13/2017 02/09/2017 02/06/2017  WBC 3.9 - 10.3 K/uL 6.8 5.3 6.8  Hemoglobin 12.0 - 15.0 g/dL - 10.7(L) 11.9  Hematocrit 34.8 - 46.6 % 38.7 33.9(L) 38.2  Platelets 145 - 400 K/uL 262 305 287  HGB 12.7 ANC 3800 . CMP Latest Ref Rng & Units 05/13/2017 02/06/2017 01/19/2017  Glucose 70 - 140 mg/dL 87 101 129  BUN 7 - 26 mg/dL 10 9.8 7.0  Creatinine 0.60 - 1.10  mg/dL 0.99 1.0 0.9  Sodium 136 - 145 mmol/L 140 140 141  Potassium 3.5 - 5.1 mmol/L 3.7 3.9 3.6  Chloride 98 - 109 mmol/L 105 - -  CO2 22 - 29 mmol/L _0 Calcium 8.4 - 10.4 mg/dL 9.8 9.5 9.1  Total Protein 6.4 - 8.3 g/dL 7.4 6.9 6.6  Total Bilirubin 0.2 - 1.2 mg/dL <0.2(L) 0.22 0.23  Alkaline Phos 40 - 150 U/L 160(H) 105 113  AST 5 - 34 U/L 24 39(H) 24  ALT 0 - 55 U/L 39 56(H) 46         RADIOGRAPHIC STUDIES: I have personally reviewed the radiological images as listed and agreed with the findings in the report. No results found.  ASSESSMENT & PLAN:   47 yo very pleasant caucasian female with   1) Stage IIA (adverse risk due to >3 regions of involvement, non bulky) Classical Hodgkins Lymphoma -Nodular Sclerosis Subtype No constitutional symptoms. Sed rate 18 (WNL) PET/CT reviewed ECHO EF 60-65% DLCO uncorrected 70% of predicted. 24h creatinine clearance WNL.  Patient is s/p 6 cycles of rx -PET from 10/07/2016 reviewed. Near complete metabolic response to therapy and no residual disease > [place] 2  -PET from 01/28/2017 reviewed. Response to therapy and no residual disease- in remission at this time  PLAN -Discussed pt labwork today; Alk Phos borderline high at 160, blood counts and  chemistries are otherwise improved and normal.  -patient has no clinical or lab evidence of hodgkins lymphoma recurrence at this time. -Advised that she begin taking B complex vitamin and Vitamin D.  -Recommend following up with PCP on elevated Alk Phos level, and we will recheck these values on next labs after pt takes Vitamin D supplements.   6) Insomnia - she has trouble sleeping some nights -trazodone prn   7) Grade 1 neuropathy from ctx. Will monitor on treatment. Partly controlled with Cymbalta Plan -continue Cymbalta to 6m po daily (for her hot flashes/neuropathy and depression).  8) Single kidney - patient donated left kidney in 2013 -avoid nephrotoxic medications -Creatinine stable  RTC with Dr KIrene Limboin 3 months with labs   All of the patients questions were answered with apparent satisfaction. The patient knows to call the clinic with any problems, questions or concerns.  .I have reviewed the above documentation for accuracy and completeness, and I agree with the above.  . The total time spent in the appointment was 15 minutes and more than 50% was on counseling and direct patient cares.   GSullivan LoneMD MMonroeAAHIVMS SGuam Regional Medical CityCBaptist Health - Heber SpringsHematology/Oncology Physician CDesoto Eye Surgery Center LLC (Office):       [phone](Work cell):  [phone](Fax):           [phone] This document serves as a record of services personally performed by GSullivan Lone MD. It was created on his behalf by SBaldwin Jamaica a trained medical scribe. The creation of this record is based on the scribe's personal observations and the provider's statements to them.   .I have reviewed the above documentation for accuracy and completeness, and I agree with the above. .Brunetta GeneraMD MS

## 2017-05-13 ENCOUNTER — Encounter: Payer: Self-pay | Admitting: Hematology

## 2017-05-13 ENCOUNTER — Telehealth: Payer: Self-pay | Admitting: Hematology

## 2017-05-13 ENCOUNTER — Inpatient Hospital Stay: Payer: 59 | Attending: Hematology | Admitting: Hematology

## 2017-05-13 ENCOUNTER — Inpatient Hospital Stay: Payer: 59

## 2017-05-13 VITALS — BP 124/83 | HR 80 | Temp 98.4°F | Resp 18 | Ht 69.0 in | Wt 210.0 lb

## 2017-05-13 DIAGNOSIS — F329 Major depressive disorder, single episode, unspecified: Secondary | ICD-10-CM | POA: Diagnosis not present

## 2017-05-13 DIAGNOSIS — F1011 Alcohol abuse, in remission: Secondary | ICD-10-CM | POA: Insufficient documentation

## 2017-05-13 DIAGNOSIS — F1911 Other psychoactive substance abuse, in remission: Secondary | ICD-10-CM | POA: Insufficient documentation

## 2017-05-13 DIAGNOSIS — Z905 Acquired absence of kidney: Secondary | ICD-10-CM | POA: Insufficient documentation

## 2017-05-13 DIAGNOSIS — G4700 Insomnia, unspecified: Secondary | ICD-10-CM | POA: Diagnosis not present

## 2017-05-13 DIAGNOSIS — G629 Polyneuropathy, unspecified: Secondary | ICD-10-CM | POA: Diagnosis not present

## 2017-05-13 DIAGNOSIS — Z87891 Personal history of nicotine dependence: Secondary | ICD-10-CM | POA: Insufficient documentation

## 2017-05-13 DIAGNOSIS — Z9221 Personal history of antineoplastic chemotherapy: Secondary | ICD-10-CM | POA: Diagnosis not present

## 2017-05-13 DIAGNOSIS — C8110 Nodular sclerosis classical Hodgkin lymphoma, unspecified site: Secondary | ICD-10-CM

## 2017-05-13 DIAGNOSIS — Z79899 Other long term (current) drug therapy: Secondary | ICD-10-CM | POA: Diagnosis not present

## 2017-05-13 DIAGNOSIS — C8148 Lymphocyte-rich classical Hodgkin lymphoma, lymph nodes of multiple sites: Secondary | ICD-10-CM

## 2017-05-13 DIAGNOSIS — Z8572 Personal history of non-Hodgkin lymphomas: Secondary | ICD-10-CM | POA: Insufficient documentation

## 2017-05-13 LAB — COMPREHENSIVE METABOLIC PANEL
ALBUMIN: 3.9 g/dL (ref 3.5–5.0)
ALT: 39 U/L (ref 0–55)
ANION GAP: 11 (ref 3–11)
AST: 24 U/L (ref 5–34)
Alkaline Phosphatase: 160 U/L — ABNORMAL HIGH (ref 40–150)
BUN: 10 mg/dL (ref 7–26)
CALCIUM: 9.8 mg/dL (ref 8.4–10.4)
CHLORIDE: 105 mmol/L (ref 98–109)
CO2: 24 mmol/L (ref 22–29)
CREATININE: 0.99 mg/dL (ref 0.60–1.10)
GFR calc Af Amer: >60 mL/min (ref >60)
GFR calc non Af Amer: >60 mL/min (ref >60)
Glucose, Bld: 87 mg/dL (ref 70–140)
Potassium: 3.7 mmol/L (ref 3.5–5.1)
SODIUM: 140 mmol/L (ref 136–145)
Total Protein: 7.4 g/dL (ref 6.4–8.3)

## 2017-05-13 LAB — CBC WITH DIFFERENTIAL (CANCER CENTER ONLY)
BASOS PCT: 0 %
Basophils Absolute: 0 10*3/uL (ref 0.0–0.1)
EOS ABS: 0.1 10*3/uL (ref 0.0–0.5)
EOS PCT: 1 %
HCT: 38.7 % (ref 34.8–46.6)
Hemoglobin: 12.7 g/dL (ref 11.6–15.9)
LYMPHS ABS: 2.7 10*3/uL (ref 0.9–3.3)
Lymphocytes Relative: 40 %
MCH: 31.4 pg (ref 25.1–34.0)
MCHC: 32.8 g/dL (ref 31.5–36.0)
MCV: 95.8 fL (ref 79.5–101.0)
MONOS PCT: 4 %
Monocytes Absolute: 0.3 10*3/uL (ref 0.1–0.9)
NEUTROS PCT: 55 %
Neutro Abs: 3.8 10*3/uL (ref 1.5–6.5)
PLATELETS: 262 10*3/uL (ref 145–400)
RBC: 4.04 MIL/uL (ref 3.70–5.45)
RDW: 12.8 % (ref 11.2–14.5)
WBC Count: 6.8 10*3/uL (ref 3.9–10.3)

## 2017-05-13 LAB — RETICULOCYTES
RBC.: 4.04 MIL/uL (ref 3.70–5.45)
RETIC CT PCT: 1.6 % (ref 0.7–2.1)
Retic Count, Absolute: 64.6 10*3/uL (ref 33.7–90.7)

## 2017-05-13 NOTE — Telephone Encounter (Signed)
Appt scheduled patient declined AVS/Calendar per 4/10 los

## 2017-06-13 ENCOUNTER — Other Ambulatory Visit: Payer: Self-pay | Admitting: Hematology

## 2017-06-24 DIAGNOSIS — J988 Other specified respiratory disorders: Secondary | ICD-10-CM | POA: Diagnosis not present

## 2017-06-29 ENCOUNTER — Other Ambulatory Visit: Payer: Self-pay | Admitting: Hematology

## 2017-07-09 ENCOUNTER — Other Ambulatory Visit: Payer: Self-pay | Admitting: Hematology

## 2017-08-12 NOTE — Progress Notes (Signed)
Marland Kitchen    HEMATOLOGY/ONCOLOGY CLINIC NOTE  Date of Service: 05/13/17   Patient Care Team: Donald Prose, MD as PCP - General (Family Medicine)  CHIEF COMPLAINTS/PURPOSE OF CONSULTATION:  F/u for management of Hodgkins lymphoma  HISTORY OF PRESENTING ILLNESS:   Rhonda Mccann is a wonderful 47 y.o. female who has been referred to [place] by Dr .Donald Prose, MD for evaluation and management of newly diagnosed Hodgkins Lymphoma.  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  Patient first reported noticing a lump in the left side of her neck in January 2018. She noted some mild progressive increase in this lump which appeared more prominent and she reported it to her primary care physician. Patient had an ultrasound of her neck on 07/07/2016 which showed a 1.9 x 0.8 x 1.4 cm lymph node with no distinct fatty hilum. This was subsequently biopsied under ultrasound guidance on 07/21/2016 and findings are consistent with classical Hodgkin's lymphoma nodular sclerosis subtype.  Patient notes no other overtly palpable lymphadenopathy noted. No fevers no chills no night sweats no unexpected weight loss. She reports no lethargy no anorexia. Overall feels quite well.  INTERVAL HISTORY  Rhonda Mccann presents for follow-up after completion of her planned 6 cycles of chemotherapy for Classical Hodgkin's Lymphoma. The patient's last visit with [place] was on 05/13/17. The pt reports that she is doing well overall and is really enjoying spending time with her new kitten.   The pt reports some continued numbness in her forefeet, worse on the right than the left, but denies any associated pain in her feet or back. She adds that she has scoliosis. She notes that the numbness is not affecting her quality of life. She adds that 64m Cymbalta is adequately addressing her symptoms. She uses Trazodone as  needed, about 3 times a week.   She also plans on returning to Vitamin D and Vitamin B complex supplements, as she did not refill these after completing a bottle each.   She adds that she has had two URIs since our last visit in April. She notes that she took prednisone and a Z pack for her second URI to successfully address a stubborn cough.   She adds that after a long day standing at work she gets pitting edema in her ankles. She denies any cold sensitivity, or association with salt intake.   Lab results today (08/13/17) of CBC w/diff, CMP, and Reticulocytes is as follows: all values are WNL except for Alk Phos at 152. Vitamin B12 on 08/13/17 is pending Sed rate 08/13/17 is 4 Vitamin D 08/13/17 is 25.1  On review of systems, pt reports resolved foot pain, continuing foot numbness, good energy levels, stable weight, occasional ankle edema, and denies fevers, chills, night sweats, noticing any new lumps or bumps, problems at the previous port site, abdominal pains, and any other symptoms.    MEDICAL HISTORY:  Past Medical History:  Diagnosis Date  . Depression   . Drug abuse ([place] 2001  EX alcohol abuse sober for 17 years  Donated left kidney in 2013 (single kidney) Scoliosis GERD  SURGICAL HISTORY: Past Surgical History:  Procedure Laterality Date  . IR FLUORO GUIDE PORT INSERTION RIGHT  08/05/2016  . IR REMOVAL TUN ACCESS W/ PORT W/O FL MOD SED  02/09/2017  . IR [place] VASC ACCESS RIGHT  08/05/2016  . NEPHRECTOMY Left 2015  .  NEPHRECTOMY LIVING DONOR  2013  Breast Augmentation Surgery in 1994 (saline implants)  SOCIAL HISTORY: Social History   Socioeconomic History  . Marital status: Married    Spouse name: Not on file  . Number of children: Not on file  . Years of education: Not on file  . Highest education level: Not on file  Occupational History  . Not on file  Social Needs  . Financial resource strain: Not on file  . Food insecurity:    Worry: Not on file     Inability: Not on file  . Transportation needs:    Medical: Not on file    Non-medical: Not on file  Tobacco Use  . Smoking status: Former Smoker    Types: Cigarettes    Last attempt to quit: 1998    Years since quitting: 21.5  . Smokeless tobacco: Never Used  . Tobacco comment: quit 17 yrs ago  Substance and Sexual Activity  . Alcohol use: No  . Drug use: No    Comment: Previous drug user - quite 2001  . Sexual activity: Yes  Lifestyle  . Physical activity:    Days per week: Not on file    Minutes per session: Not on file  . Stress: Not on file  Relationships  . Social connections:    Talks on phone: Not on file    Gets together: Not on file    Attends religious service: Not on file    Active member of club or organization: Not on file    Attends meetings of clubs or organizations: Not on file    Relationship status: Not on file  . Intimate partner violence:    Fear of current or ex partner: Not on file    Emotionally abused: Not on file    Physically abused: Not on file    Forced sexual activity: Not on file  Other Topics Concern  . Not on file  Social History Narrative  . Not on file  Works as a Production manager   FAMILY HISTORY: Paternal aunt had breast cancer Paternal grandfather had lymphoma  ALLERGIES:  is allergic to contrast media [iodinated diagnostic agents] and nsaids.  MEDICATIONS:  Current Outpatient Medications  Medication Sig Dispense Refill  . DULoxetine (CYMBALTA) 60 MG capsule TAKE 1 CAPSULE BY MOUTH EVERY DAY 90 capsule 1  . omeprazole (PRILOSEC) 20 MG capsule Take 20 mg by mouth daily.    . traZODone (DESYREL) 100 MG tablet TAKE 1 TABLET (100 MG TOTAL) BY MOUTH AT BEDTIME AS NEEDED FOR SLEEP. 90 tablet 1   No current facility-administered medications for this visit.     REVIEW OF SYSTEMS:    A 10+ POINT REVIEW OF SYSTEMS WAS OBTAINED including neurology, dermatology, psychiatry, cardiac, respiratory, lymph, extremities, GI, GU,  Musculoskeletal, constitutional, breasts, reproductive, HEENT.  All pertinent positives are noted in the HPI.  All others are negative.   PHYSICAL EXAMINATION: ECOG PERFORMANCE STATUS: 0 - Asymptomatic  .BP 116/69 (BP Location: Left Arm, Patient Position: Sitting)   Pulse 71   Temp 98.4 F (36.9 C) (Oral)   Resp 18   Ht _0  (1.753 m)   Wt 209 lb (94.8 kg)   SpO2 99%   BMI 30.86 kg/m   GENERAL:alert, in no acute distress and comfortable SKIN: no acute rashes, no significant lesions EYES: conjunctiva are pink and non-injected, sclera anicteric OROPHARYNX: MMM, no exudates, no oropharyngeal erythema or ulceration NECK: supple, no JVD LYMPH:  no palpable lymphadenopathy in  the cervical, axillary or inguinal regions LUNGS: clear to auscultation b/l with normal respiratory effort HEART: regular rate & rhythm ABDOMEN:  normoactive bowel sounds , non tender, not distended. No palpable hepatosplenomegaly.  Extremity: no pedal edema PSYCH: alert & oriented x 3 with fluent speech NEURO: no focal motor/sensory deficits  LABORATORY DATA:  I have reviewed the data as listed  . CBC Latest Ref Rng & Units 08/13/2017 05/13/2017 02/09/2017  WBC 3.9 - 10.3 K/uL 6.4 6.8 5.3  Hemoglobin 11.6 - 15.9 g/dL 12.9 12.7 10.7(L)  Hematocrit 34.8 - 46.6 % 39.0 38.7 33.9(L)  Platelets 145 - 400 K/uL 257 262 305  HGB 12.7 ANC 3800 . CMP Latest Ref Rng & Units 08/13/2017 05/13/2017 02/06/2017  Glucose 70 - 99 mg/dL 90 87 101  BUN 6 - 20 mg/dL 10 10 9.8  Creatinine 0.44 - 1.00 mg/dL 0.97 0.99 1.0  Sodium 135 - 145 mmol/L 138 140 140  Potassium 3.5 - 5.1 mmol/L 3.9 3.7 3.9  Chloride 98 - 111 mmol/L 106 105 -  CO2 22 - 32 mmol/L _0 Calcium 8.9 - 10.3 mg/dL 9.1 9.8 9.5  Total Protein 6.5 - 8.1 g/dL 6.9 7.4 6.9  Total Bilirubin 0.3 - 1.2 mg/dL 0.3 <0.2(L) 0.22  Alkaline Phos 38 - 126 U/L 152(H) 160(H) 105  AST 15 - 41 U/L 19 24 39(H)  ALT 0 - 44 U/L 24 39 56(H)         RADIOGRAPHIC  STUDIES: I have personally reviewed the radiological images as listed and agreed with the findings in the report. No results found.  ASSESSMENT & PLAN:   47 y.o. very pleasant caucasian female with   1) Stage IIA (adverse risk due to >3 regions of involvement, non bulky) Classical Hodgkins Lymphoma -Nodular Sclerosis Subtype No constitutional symptoms. Sed rate 18 (WNL) PET/CT reviewed ECHO EF 60-65% DLCO uncorrected 70% of predicted. 24h creatinine clearance WNL.  Patient is s/p 6 cycles of rx -PET from 10/07/2016 reviewed. Near complete metabolic response to therapy and no residual disease > Deauville 2  -PET from 01/28/2017 reviewed. Response to therapy and no residual disease- in remission at this time  PLAN:  -Advised that she begin taking B complex vitamin and Vitamin D.  -Recommend following up with PCP on elevated Alk Phos level, and we will recheck these values on next labs after pt takes Vitamin D supplements.  -Discussed pt labwork today, 08/13/17; blood counts are normal, blood chemistries normal except for Alk Phos which has continued to be slightly elevated at 152 -Recommend sports compression socks for edema that develops on long days -Continue staying active, well hydrated, and well nourished -The pt shows no clinical or lab progression of CHL at this time.  -No indication for further treatment at this time.    6) Insomnia - she has trouble sleeping some nights -trazodone prn   7) Grade 1 neuropathy from ctx. Will monitor on treatment. Partly controlled with Cymbalta Plan -continue Cymbalta to 35m po daily (for her hot flashes/neuropathy and depression).  8) Single kidney - patient donated left kidney in 2013 -avoid nephrotoxic medications -Creatinine stable   RTC with Dr KIrene Limboin 3 months with labs   All of the patients questions were answered with apparent satisfaction. The patient knows to call the clinic with any problems, questions or concerns.   The  total time spent in the appt was 25 minutes and more than 50% was on counseling and direct patient cares.  Sullivan Lone MD [place] AAHIVMS [facility] Hematology/Oncology Physician [facility]  (Office):       [phone] (Work cell):  [phone] (Fax):           [phone]  I, Baldwin Jamaica, am acting as a scribe for Dr Irene Limbo.   .I have reviewed the above documentation for accuracy and completeness, and I agree with the above. Brunetta Genera MD

## 2017-08-13 ENCOUNTER — Inpatient Hospital Stay: Payer: 59 | Attending: Hematology

## 2017-08-13 ENCOUNTER — Inpatient Hospital Stay (HOSPITAL_BASED_OUTPATIENT_CLINIC_OR_DEPARTMENT_OTHER): Payer: 59 | Admitting: Hematology

## 2017-08-13 ENCOUNTER — Encounter: Payer: Self-pay | Admitting: Hematology

## 2017-08-13 VITALS — BP 116/69 | HR 71 | Temp 98.4°F | Resp 18 | Ht 69.0 in | Wt 209.0 lb

## 2017-08-13 DIAGNOSIS — R232 Flushing: Secondary | ICD-10-CM | POA: Diagnosis not present

## 2017-08-13 DIAGNOSIS — C8148 Lymphocyte-rich classical Hodgkin lymphoma, lymph nodes of multiple sites: Secondary | ICD-10-CM

## 2017-08-13 DIAGNOSIS — Z87891 Personal history of nicotine dependence: Secondary | ICD-10-CM

## 2017-08-13 DIAGNOSIS — M419 Scoliosis, unspecified: Secondary | ICD-10-CM

## 2017-08-13 DIAGNOSIS — Z9221 Personal history of antineoplastic chemotherapy: Secondary | ICD-10-CM

## 2017-08-13 DIAGNOSIS — Z79899 Other long term (current) drug therapy: Secondary | ICD-10-CM | POA: Diagnosis not present

## 2017-08-13 DIAGNOSIS — G4700 Insomnia, unspecified: Secondary | ICD-10-CM | POA: Diagnosis not present

## 2017-08-13 DIAGNOSIS — Z8572 Personal history of non-Hodgkin lymphomas: Secondary | ICD-10-CM | POA: Diagnosis not present

## 2017-08-13 DIAGNOSIS — F329 Major depressive disorder, single episode, unspecified: Secondary | ICD-10-CM | POA: Insufficient documentation

## 2017-08-13 DIAGNOSIS — R200 Anesthesia of skin: Secondary | ICD-10-CM | POA: Insufficient documentation

## 2017-08-13 DIAGNOSIS — K219 Gastro-esophageal reflux disease without esophagitis: Secondary | ICD-10-CM | POA: Insufficient documentation

## 2017-08-13 DIAGNOSIS — Z905 Acquired absence of kidney: Secondary | ICD-10-CM

## 2017-08-13 DIAGNOSIS — G629 Polyneuropathy, unspecified: Secondary | ICD-10-CM | POA: Diagnosis not present

## 2017-08-13 DIAGNOSIS — C8110 Nodular sclerosis classical Hodgkin lymphoma, unspecified site: Secondary | ICD-10-CM

## 2017-08-13 LAB — CBC WITH DIFFERENTIAL/PLATELET
BASOS PCT: 0 %
Basophils Absolute: 0 10*3/uL (ref 0.0–0.1)
EOS ABS: 0.1 10*3/uL (ref 0.0–0.5)
EOS PCT: 1 %
HEMATOCRIT: 39 % (ref 34.8–46.6)
Hemoglobin: 12.9 g/dL (ref 11.6–15.9)
Lymphocytes Relative: 37 %
Lymphs Abs: 2.4 10*3/uL (ref 0.9–3.3)
MCH: 32.1 pg (ref 25.1–34.0)
MCHC: 33.1 g/dL (ref 31.5–36.0)
MCV: 97 fL (ref 79.5–101.0)
MONO ABS: 0.3 10*3/uL (ref 0.1–0.9)
MONOS PCT: 5 %
NEUTROS ABS: 3.6 10*3/uL (ref 1.5–6.5)
Neutrophils Relative %: 57 %
Platelets: 257 10*3/uL (ref 145–400)
RBC: 4.02 MIL/uL (ref 3.70–5.45)
RDW: 12.9 % (ref 11.2–14.5)
WBC: 6.4 10*3/uL (ref 3.9–10.3)

## 2017-08-13 LAB — CMP (CANCER CENTER ONLY)
ALBUMIN: 3.9 g/dL (ref 3.5–5.0)
ALK PHOS: 152 U/L — AB (ref 38–126)
ALT: 24 U/L (ref 0–44)
AST: 19 U/L (ref 15–41)
Anion gap: 7 (ref 5–15)
BUN: 10 mg/dL (ref 6–20)
CALCIUM: 9.1 mg/dL (ref 8.9–10.3)
CO2: 25 mmol/L (ref 22–32)
CREATININE: 0.97 mg/dL (ref 0.44–1.00)
Chloride: 106 mmol/L (ref 98–111)
GFR, Est AFR Am: >60 mL/min (ref >60)
GFR, Estimated: >60 mL/min (ref >60)
GLUCOSE: 90 mg/dL (ref 70–99)
Potassium: 3.9 mmol/L (ref 3.5–5.1)
SODIUM: 138 mmol/L (ref 135–145)
Total Bilirubin: 0.3 mg/dL (ref 0.3–1.2)
Total Protein: 6.9 g/dL (ref 6.5–8.1)

## 2017-08-13 LAB — RETICULOCYTES
RBC.: 4.02 MIL/uL (ref 3.70–5.45)
Retic Count, Absolute: 64.3 10*3/uL (ref 33.7–90.7)
Retic Ct Pct: 1.6 % (ref 0.7–2.1)

## 2017-08-13 LAB — VITAMIN B12: Vitamin B-12: 217 pg/mL (ref 180–914)

## 2017-08-13 LAB — SEDIMENTATION RATE: Sed Rate: 4 mm/hr (ref 0–22)

## 2017-08-14 ENCOUNTER — Telehealth: Payer: Self-pay

## 2017-08-14 LAB — VITAMIN D 25 HYDROXY (VIT D DEFICIENCY, FRACTURES): Vit D, 25-Hydroxy: 25.1 ng/mL — ABNORMAL LOW (ref 30.0–100.0)

## 2017-08-14 NOTE — Telephone Encounter (Signed)
Spoke with patient concerning upcoming appointment. Mailed a letter and calender enclosed per 7/11 los

## 2017-08-17 ENCOUNTER — Telehealth: Payer: Self-pay

## 2017-08-17 NOTE — Telephone Encounter (Signed)
Called patient and made her aware that Dr. Irene Limbo stated her sed rate is WNL and her Vitamin D levels are borderline now. Patient instructed to take over the counter Vitamin D 2000 units daily per Dr. Grier Mitts instructions. Patient verbalized understanding.

## 2017-10-04 ENCOUNTER — Other Ambulatory Visit: Payer: Self-pay | Admitting: Hematology

## 2017-11-12 ENCOUNTER — Inpatient Hospital Stay: Payer: 59 | Admitting: Hematology

## 2017-11-12 ENCOUNTER — Inpatient Hospital Stay: Payer: 59 | Attending: Hematology

## 2017-11-12 ENCOUNTER — Encounter: Payer: Self-pay | Admitting: Hematology

## 2017-11-12 VITALS — BP 113/74 | HR 66 | Temp 97.8°F | Resp 20 | Ht 69.0 in | Wt 208.3 lb

## 2017-11-12 DIAGNOSIS — Z79899 Other long term (current) drug therapy: Secondary | ICD-10-CM | POA: Insufficient documentation

## 2017-11-12 DIAGNOSIS — C8148 Lymphocyte-rich classical Hodgkin lymphoma, lymph nodes of multiple sites: Secondary | ICD-10-CM

## 2017-11-12 DIAGNOSIS — Z806 Family history of leukemia: Secondary | ICD-10-CM

## 2017-11-12 DIAGNOSIS — F1911 Other psychoactive substance abuse, in remission: Secondary | ICD-10-CM | POA: Insufficient documentation

## 2017-11-12 DIAGNOSIS — Z87891 Personal history of nicotine dependence: Secondary | ICD-10-CM | POA: Insufficient documentation

## 2017-11-12 DIAGNOSIS — K219 Gastro-esophageal reflux disease without esophagitis: Secondary | ICD-10-CM | POA: Insufficient documentation

## 2017-11-12 DIAGNOSIS — G629 Polyneuropathy, unspecified: Secondary | ICD-10-CM | POA: Insufficient documentation

## 2017-11-12 DIAGNOSIS — Z803 Family history of malignant neoplasm of breast: Secondary | ICD-10-CM | POA: Diagnosis not present

## 2017-11-12 DIAGNOSIS — Z23 Encounter for immunization: Secondary | ICD-10-CM

## 2017-11-12 DIAGNOSIS — Z905 Acquired absence of kidney: Secondary | ICD-10-CM | POA: Insufficient documentation

## 2017-11-12 DIAGNOSIS — F329 Major depressive disorder, single episode, unspecified: Secondary | ICD-10-CM

## 2017-11-12 DIAGNOSIS — Z8571 Personal history of Hodgkin lymphoma: Secondary | ICD-10-CM

## 2017-11-12 DIAGNOSIS — Z9221 Personal history of antineoplastic chemotherapy: Secondary | ICD-10-CM

## 2017-11-12 DIAGNOSIS — G4700 Insomnia, unspecified: Secondary | ICD-10-CM | POA: Insufficient documentation

## 2017-11-12 DIAGNOSIS — F1021 Alcohol dependence, in remission: Secondary | ICD-10-CM | POA: Insufficient documentation

## 2017-11-12 DIAGNOSIS — M419 Scoliosis, unspecified: Secondary | ICD-10-CM

## 2017-11-12 LAB — CBC WITH DIFFERENTIAL/PLATELET
ABS IMMATURE GRANULOCYTES: 0.01 10*3/uL (ref 0.00–0.07)
Basophils Absolute: 0 10*3/uL (ref 0.0–0.1)
Basophils Relative: 0 %
Eosinophils Absolute: 0.1 10*3/uL (ref 0.0–0.5)
Eosinophils Relative: 1 %
HCT: 39.5 % (ref 36.0–46.0)
Hemoglobin: 13.4 g/dL (ref 12.0–15.0)
IMMATURE GRANULOCYTES: 0 %
Lymphocytes Relative: 32 %
Lymphs Abs: 2.8 10*3/uL (ref 0.7–4.0)
MCH: 32 pg (ref 26.0–34.0)
MCHC: 33.9 g/dL (ref 30.0–36.0)
MCV: 94.3 fL (ref 80.0–100.0)
MONOS PCT: 4 %
Monocytes Absolute: 0.3 10*3/uL (ref 0.1–1.0)
NEUTROS ABS: 5.6 10*3/uL (ref 1.7–7.7)
NEUTROS PCT: 63 %
PLATELETS: 247 10*3/uL (ref 150–400)
RBC: 4.19 MIL/uL (ref 3.87–5.11)
RDW: 13.1 % (ref 11.5–15.5)
WBC: 8.9 10*3/uL (ref 4.0–10.5)
nRBC: 0 % (ref 0.0–0.2)

## 2017-11-12 LAB — CMP (CANCER CENTER ONLY)
ALBUMIN: 4.1 g/dL (ref 3.5–5.0)
ALK PHOS: 126 U/L (ref 38–126)
ALT: 19 U/L (ref 0–44)
ANION GAP: 9 (ref 5–15)
AST: 16 U/L (ref 15–41)
BILIRUBIN TOTAL: 0.4 mg/dL (ref 0.3–1.2)
BUN: 10 mg/dL (ref 6–20)
CALCIUM: 9.8 mg/dL (ref 8.9–10.3)
CO2: 23 mmol/L (ref 22–32)
Chloride: 108 mmol/L (ref 98–111)
Creatinine: 1.06 mg/dL — ABNORMAL HIGH (ref 0.44–1.00)
GFR, Est AFR Am: >60 mL/min (ref >60)
GFR, Estimated: >60 mL/min (ref >60)
GLUCOSE: 95 mg/dL (ref 70–99)
POTASSIUM: 4.1 mmol/L (ref 3.5–5.1)
SODIUM: 140 mmol/L (ref 135–145)
TOTAL PROTEIN: 7.4 g/dL (ref 6.5–8.1)

## 2017-11-12 LAB — SEDIMENTATION RATE: Sed Rate: 12 mm/hr (ref 0–22)

## 2017-11-12 MED ORDER — INFLUENZA VAC SPLIT QUAD 0.5 ML IM SUSY
PREFILLED_SYRINGE | INTRAMUSCULAR | Status: AC
  Filled 2017-11-12: qty 0.5

## 2017-11-12 MED ORDER — INFLUENZA VAC SPLIT QUAD 0.5 ML IM SUSY: 0.5000 mL | PREFILLED_SYRINGE | Freq: Once | INTRAMUSCULAR | Status: DC

## 2017-11-12 NOTE — Progress Notes (Signed)
Marland Kitchen    HEMATOLOGY/ONCOLOGY CLINIC NOTE  Date of Service: 11/12/2017   Patient Care Team: Donald Prose, MD as PCP - General (Family Medicine)  CHIEF COMPLAINTS/PURPOSE OF CONSULTATION:  F/u for management of Hodgkins lymphoma  HISTORY OF PRESENTING ILLNESS:   Rhonda Mccann is a wonderful 47 y.o. female who has been referred to [place] by Dr .Donald Prose, MD for evaluation and management of newly diagnosed Hodgkins Lymphoma.  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  Patient first reported noticing a lump in the left side of her neck in January 2018. She noted some mild progressive increase in this lump which appeared more prominent and she reported it to her primary care physician. Patient had an ultrasound of her neck on 07/07/2016 which showed a 1.9 x 0.8 x 1.4 cm lymph node with no distinct fatty hilum. This was subsequently biopsied under ultrasound guidance on 07/21/2016 and findings are consistent with classical Hodgkin's lymphoma nodular sclerosis subtype.  Patient notes no other overtly palpable lymphadenopathy noted. No fevers no chills no night sweats no unexpected weight loss. She reports no lethargy no anorexia. Overall feels quite well.  INTERVAL HISTORY  Ms. Rhonda Mccann presents for follow-up after completion of her planned 6 cycles of chemotherapy for Classical Hodgkin's Lymphoma. The patient's last visit with [place] was on 08/13/17. The pt reports that she is doing well overall.   The pt notes that she has not developed any constitutional symptoms in the interim and has not noticed any new lumps or bumps.   The pt reports that her neuropathy has not gotten any better in the interim. She notes that the neuropathy is most noticeable when she wears heels or after working a long shift at work. Her neuropathy in her feet has not worsened and remains in her toes  and the balls of her feet. She denies associated pain. She continue on 60mg  Cymbalta.   The pt notes that she takes Omeprazole every three days or so and denies her acid reflux being uncontrolled.   The patient notes that she has recently learned that her paternal aunt had breast cancer in 65s and maternal great-aunt also had breast cancer. Her paternal grandfather had lymphoma. Maternal grandfather had pancreatic cancer.   Lab results today (11/12/17) of CBC w/diff, CMP is as follows: all values are WNL except for Creatinine at 1.06. 11/12/17 Sed Rate at 12  On review of systems, pt reports persisting tingling and numbness in her toes and balls of her feet, good energy levels, and denies painful neuropathy, fevers, chills, night sweats, noticing any new lumps or bumps, abdominal pains, leg swelling, and any other symptoms.    MEDICAL HISTORY:  Past Medical History:  Diagnosis Date  . Depression   . Drug abuse ([place]) 2001  EX alcohol abuse sober for 17 years  Donated left kidney in 2013 (single kidney) Scoliosis GERD  SURGICAL HISTORY: Past Surgical History:  Procedure Laterality Date  . IR FLUORO GUIDE PORT INSERTION RIGHT  08/05/2016  . IR REMOVAL TUN ACCESS W/ PORT W/O FL MOD SED  02/09/2017  . IR US GUIDE VASC ACCESS RIGHT  08/05/2016  . NEPHRECTOMY Left 2015  . NEPHRECTOMY LIVING DONOR  2013  Breast Augmentation Surgery in 1994 (saline implants)  SOCIAL HISTORY: Social History   Socioeconomic History  . Marital status: Married    Spouse name: Not on file  .  Number of children: Not on file  . Years of education: Not on file  . Highest education level: Not on file  Occupational History  . Not on file  Social Needs  . Financial resource strain: Not on file  . Food insecurity:    Worry: Not on file    Inability: Not on file  . Transportation needs:    Medical: Not on file    Non-medical: Not on file  Tobacco Use  . Smoking status: Former Smoker    Types: Cigarettes     Last attempt to quit: 1998    Years since quitting: 21.7  . Smokeless tobacco: Never Used  . Tobacco comment: quit 17 yrs ago  Substance and Sexual Activity  . Alcohol use: No  . Drug use: No    Comment: Previous drug user - quite 2001  . Sexual activity: Yes  Lifestyle  . Physical activity:    Days per week: Not on file    Minutes per session: Not on file  . Stress: Not on file  Relationships  . Social connections:    Talks on phone: Not on file    Gets together: Not on file    Attends religious service: Not on file    Active member of club or organization: Not on file    Attends meetings of clubs or organizations: Not on file    Relationship status: Not on file  . Intimate partner violence:    Fear of current or ex partner: Not on file    Emotionally abused: Not on file    Physically abused: Not on file    Forced sexual activity: Not on file  Other Topics Concern  . Not on file  Social History Narrative  . Not on file  Works as a Production manager   FAMILY HISTORY: Paternal aunt had breast cancer Paternal grandfather had lymphoma  ALLERGIES:  is allergic to contrast media [iodinated diagnostic agents] and nsaids.  MEDICATIONS:  Current Outpatient Medications  Medication Sig Dispense Refill  . b complex vitamins capsule Take 1 capsule by mouth daily.    . Multiple Vitamin (MULTIVITAMIN) tablet Take 1 tablet by mouth daily.    . Vitamin D, Ergocalciferol, 2000 units CAPS Take 1 capsule by mouth.    . DULoxetine (CYMBALTA) 60 MG capsule TAKE 1 CAPSULE BY MOUTH EVERY DAY 90 capsule 0  . omeprazole (PRILOSEC) 20 MG capsule Take 20 mg by mouth daily.    . traZODone (DESYREL) 100 MG tablet TAKE 1 TABLET (100 MG TOTAL) BY MOUTH AT BEDTIME AS NEEDED FOR SLEEP. 90 tablet 1   No current facility-administered medications for this visit.     REVIEW OF SYSTEMS:    A 10+ POINT REVIEW OF SYSTEMS WAS OBTAINED including neurology, dermatology, psychiatry, cardiac, respiratory, lymph,  extremities, GI, GU, Musculoskeletal, constitutional, breasts, reproductive, HEENT.  All pertinent positives are noted in the HPI.  All others are negative.   PHYSICAL EXAMINATION: ECOG PERFORMANCE STATUS: 0 - Asymptomatic  .BP 113/74 (BP Location: Left Arm, Patient Position: Sitting)   Pulse 66   Temp 97.8 F (36.6 C) (Oral)   Resp 20   Ht 5\' 9"  (1.753 m)   Wt 208 lb 4.8 oz (94.5 kg)   SpO2 100%   BMI 30.76 kg/m   GENERAL:alert, in no acute distress and comfortable SKIN: no acute rashes, no significant lesions EYES: conjunctiva are pink and non-injected, sclera anicteric OROPHARYNX: MMM, no exudates, no oropharyngeal erythema or ulceration NECK: supple,  no JVD LYMPH:  no palpable lymphadenopathy in the cervical, axillary or inguinal regions LUNGS: clear to auscultation b/l with normal respiratory effort HEART: regular rate & rhythm ABDOMEN:  normoactive bowel sounds , non tender, not distended. No palpable hepatosplenomegaly.  Extremity: no pedal edema PSYCH: alert & oriented x 3 with fluent speech NEURO: no focal motor/sensory deficits   LABORATORY DATA:  I have reviewed the data as listed  . CBC Latest Ref Rng & Units 11/12/2017 08/13/2017 05/13/2017  WBC 4.0 - 10.5 K/uL 8.9 6.4 6.8  Hemoglobin 12.0 - 15.0 g/dL 13.4 12.9 12.7  Hematocrit 36.0 - 46.0 % 39.5 39.0 38.7  Platelets 150 - 400 K/uL 247 257 262  HGB 12.7 ANC 3800 . CMP Latest Ref Rng & Units 11/12/2017 08/13/2017 05/13/2017  Glucose 70 - 99 mg/dL 95 90 87  BUN 6 - 20 mg/dL 10 10 10   Creatinine 0.44 - 1.00 mg/dL 1.06(H) 0.97 0.99  Sodium 135 - 145 mmol/L 140 138 140  Potassium 3.5 - 5.1 mmol/L 4.1 3.9 3.7  Chloride 98 - 111 mmol/L 108 106 105  CO2 22 - 32 mmol/L 23 25 24   Calcium 8.9 - 10.3 mg/dL 9.8 9.1 9.8  Total Protein 6.5 - 8.1 g/dL 7.4 6.9 7.4  Total Bilirubin 0.3 - 1.2 mg/dL 0.4 0.3 <0.2(L)  Alkaline Phos 38 - 126 U/L 126 152(H) 160(H)  AST 15 - 41 U/L 16 19 24   ALT 0 - 44 U/L 19 24 39          RADIOGRAPHIC STUDIES: I have personally reviewed the radiological images as listed and agreed with the findings in the report. No results found.  ASSESSMENT & PLAN:   47 y.o. very pleasant caucasian female with   1) Stage IIA (adverse risk due to >3 regions of involvement, non bulky) Classical Hodgkins Lymphoma -Nodular Sclerosis Subtype No constitutional symptoms. Sed rate 18 (WNL) PET/CT reviewed ECHO EF 60-65% DLCO uncorrected 70% of predicted. 24h creatinine clearance WNL.  Patient is s/p 6 cycles of rx -PET from 10/07/2016 reviewed. Near complete metabolic response to therapy and no residual disease > Deauville 2  -PET from 01/28/2017 reviewed. Response to therapy and no residual disease- in remission at this time  PLAN:  -Recommend sports compression socks for edema that develops on long days -Discussed pt labwork today, 11/12/17; all blood counts are normal, blood chemistries are stable. Sed rate is normal at 12  -Continue Vitamin B complex and Vitamin D replacement -Continue 60mg  Cymbalta -Will give annual flu vaccine today in clinic  -Continue age-appropriate cancer screening with PCP and annual mammograms -Will refer pt to genetic counselor, which the pt requests given her fhx hx of cancers -Bone density testing in the interim  -The pt shows no clinical or lab return/progression of Hodgkin's lymphoma at this time.  -No indication for further treatment at this time.   -Will see the pt back in 3 months   6) Insomnia - she has trouble sleeping some nights -trazodone prn   7) Grade 1 neuropathy from ctx. Will monitor on treatment. Partly controlled with Cymbalta Plan -continue Cymbalta to 60mg  po daily (for her hot flashes/neuropathy and depression).  8) Single kidney - patient donated left kidney in 2013 -avoid nephrotoxic medications -Creatinine stable   Referral to genetic counselor in 2-4 weeks DEXA scan in 12 weeks  RTC with Dr Irene Limbo in 3  months with labs Flu shot today   All of the patients questions were answered with apparent satisfaction.  The patient knows to call the clinic with any problems, questions or concerns.   The total time spent in the appt was 25 minutes and more than 50% was on counseling and direct patient cares.     Sullivan Lone MD MS AAHIVMS [facility] Hematology/Oncology Physician [facility]  (Office):       [phone] (Work cell):  [phone] (Fax):           [phone]  I, Baldwin Jamaica, am acting as a scribe for Dr. Irene Limbo  .I have reviewed the above documentation for accuracy and completeness, and I agree with the above. Brunetta Genera MD

## 2017-11-13 ENCOUNTER — Telehealth: Payer: Self-pay

## 2017-11-13 NOTE — Telephone Encounter (Signed)
Spoke with patient concerning her newly scheduled appointment. Per 10/10 Will also mail a letter with a calender enclosed

## 2017-11-18 DIAGNOSIS — M9903 Segmental and somatic dysfunction of lumbar region: Secondary | ICD-10-CM | POA: Diagnosis not present

## 2017-11-18 DIAGNOSIS — M5416 Radiculopathy, lumbar region: Secondary | ICD-10-CM | POA: Diagnosis not present

## 2017-11-18 DIAGNOSIS — M4125 Other idiopathic scoliosis, thoracolumbar region: Secondary | ICD-10-CM | POA: Diagnosis not present

## 2017-11-23 ENCOUNTER — Telehealth: Payer: Self-pay

## 2017-11-23 NOTE — Telephone Encounter (Signed)
Spoke with patient concerning rescheduling her appointments due to patient will be out of town. Per 10/21 phone msg return. R/S for following Monday per patient requested date

## 2017-12-02 ENCOUNTER — Inpatient Hospital Stay (HOSPITAL_BASED_OUTPATIENT_CLINIC_OR_DEPARTMENT_OTHER): Payer: 59 | Admitting: Genetic Counselor

## 2017-12-02 ENCOUNTER — Encounter: Payer: Self-pay | Admitting: Genetic Counselor

## 2017-12-02 ENCOUNTER — Inpatient Hospital Stay: Payer: 59

## 2017-12-02 DIAGNOSIS — C8148 Lymphocyte-rich classical Hodgkin lymphoma, lymph nodes of multiple sites: Secondary | ICD-10-CM

## 2017-12-02 DIAGNOSIS — Z800 Family history of malignant neoplasm of digestive organs: Secondary | ICD-10-CM

## 2017-12-02 DIAGNOSIS — Z8571 Personal history of Hodgkin lymphoma: Secondary | ICD-10-CM | POA: Diagnosis not present

## 2017-12-02 DIAGNOSIS — Z807 Family history of other malignant neoplasms of lymphoid, hematopoietic and related tissues: Secondary | ICD-10-CM | POA: Diagnosis not present

## 2017-12-02 DIAGNOSIS — Z7183 Encounter for nonprocreative genetic counseling: Secondary | ICD-10-CM

## 2017-12-02 DIAGNOSIS — Z803 Family history of malignant neoplasm of breast: Secondary | ICD-10-CM | POA: Diagnosis not present

## 2017-12-03 ENCOUNTER — Encounter: Payer: Self-pay | Admitting: Genetic Counselor

## 2017-12-03 DIAGNOSIS — Z807 Family history of other malignant neoplasms of lymphoid, hematopoietic and related tissues: Secondary | ICD-10-CM | POA: Insufficient documentation

## 2017-12-03 DIAGNOSIS — Z803 Family history of malignant neoplasm of breast: Secondary | ICD-10-CM | POA: Insufficient documentation

## 2017-12-03 DIAGNOSIS — Z800 Family history of malignant neoplasm of digestive organs: Secondary | ICD-10-CM | POA: Insufficient documentation

## 2017-12-03 NOTE — Progress Notes (Signed)
REFERRING PROVIDER: Brunetta Genera, MD [place], [place] [address]  PRIMARY PROVIDER:  Donald Prose, MD  PRIMARY REASON FOR VISIT:  1. 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 Family history of pancreatic cancer   3. Family history of breast cancer   4. Family history of non-Hodgkin's lymphoma      HISTORY OF PRESENT ILLNESS:   Rhonda Mccann, a 47 y.o. female, was seen for a [place] cancer genetics consultation at the request of Dr. Irene Limbo due to a personal and family history of cancer.  Rhonda Mccann presents to clinic today to discuss the possibility of a hereditary predisposition to cancer, genetic testing, and to further clarify her future cancer risks, as well as potential cancer risks for family members.   In 2018, at the age of 31, Rhonda Mccann was diagnosed with Hodgkin's Lymphoma. This was treated with chemotherapy.  She was a kidney donor in 2013.    CANCER HISTORY:   No history exists.     HORMONAL RISK FACTORS:  Menarche was at age 25.  First live birth at age 28.  OCP use for approximately 2-3 years.  Ovaries intact: yes.  Hysterectomy: no.  Menopausal status: premenopausal.  HRT use: 0 years. Colonoscopy: no; not examined. Mammogram within the last year: yes. Number of breast biopsies: 0. Up to date with pelvic exams:  yes. Any excessive radiation exposure in the past:  no  Past Medical History:  Diagnosis Date  . Depression   . Drug abuse ([place]) 2001  . Family history of breast cancer   . Family history of non-Hodgkin's lymphoma   . Family history of pancreatic cancer     Past Surgical History:  Procedure Laterality Date  . IR FLUORO GUIDE PORT INSERTION RIGHT  08/05/2016  . IR REMOVAL TUN ACCESS W/ PORT W/O FL MOD SED  02/09/2017  . IR US GUIDE VASC ACCESS RIGHT  08/05/2016  . NEPHRECTOMY Left 2015  . NEPHRECTOMY LIVING DONOR  2013    Social History   Socioeconomic History  . Marital status:  Married    Spouse name: Not on file  . Number of children: Not on file  . Years of education: Not on file  . Highest education level: Not on file  Occupational History  . Not on file  Social Needs  . Financial resource strain: Not on file  . Food insecurity:    Worry: Not on file    Inability: Not on file  . Transportation needs:    Medical: Not on file    Non-medical: Not on file  Tobacco Use  . Smoking status: Former Smoker    Types: Cigarettes    Last attempt to quit: 1998    Years since quitting: 21.8  . Smokeless tobacco: Never Used  . Tobacco comment: quit 17 yrs ago  Substance and Sexual Activity  . Alcohol use: No  . Drug use: No    Comment: Previous drug user - quite 2001  . Sexual activity: Yes  Lifestyle  . Physical activity:    Days per week: Not on file    Minutes per session: Not on file  . Stress: Not on file  Relationships  . Social connections:    Talks on phone: Not on file    Gets together: Not on file    Attends religious service: Not on file    Active member of club or organization: Not on file  Attends meetings of clubs or organizations: Not on file    Relationship status: Not on file  Other Topics Concern  . Not on file  Social History Narrative  . Not on file     FAMILY HISTORY:  We obtained a detailed, 4-generation family history.  Significant diagnoses are listed below: Family History  Problem Relation Age of Onset  . Hypertension Father   . High Cholesterol Father   . Breast cancer Paternal Aunt 17  . Pancreatic cancer Maternal Grandfather   . Non-Hodgkin's lymphoma Paternal Grandfather   . Breast cancer Other        MGF's sister    The patient has one son who is cancer free, but has schizophrenia.  She has one sister who is cancer free.  Both parents are living.  The patient's mother had three brothers.  Two died in MVA's and one died from blunt trauma from the head.  The grandmother is deceased from non cancer related issues  and the grandfather had pancreatic cancer.  His sister died of breast cancer.  The patient's father had one sister who had breast cancer at 30.  His parents are deceased.The grandfather had NHL lymphoma.  Rhonda Mccann is unaware of previous family history of genetic testing for hereditary cancer risks. Patient's maternal ancestors are of [place] and [place] descent, and paternal ancestors are of Caucasian descent. There is no reported Ashkenazi Jewish ancestry. There is no known consanguinity.  GENETIC COUNSELING ASSESSMENT: Rhonda Mccann is a 47 y.o. female with a personal and family history of cancer which is somewhat suggestive of a hereditary cancer syndrome and predisposition to cancer. We, therefore, discussed and recommended the following at today's visit.   DISCUSSION: We discussed that about 5-10% of breast cancer is hereditary with most cases due to BRCA mutations.    We discussed that the maternal side of the family is somewhat limited as her mother only had brothers, however her mother is cancer free and she did not have a preventive surgery such as a TAH-BSO. We discussed with Rhonda Mccann that the family history is not highly consistent with a familial hereditary cancer syndrome, and we feel she is at low risk to harbor a gene mutation associated with such a condition. However, she meets criteria based on her maternal grandfather's pancreatic cancer and the breast cancer in his sister.  We reviewed the characteristics, features and inheritance patterns of hereditary cancer syndromes. We also discussed genetic testing, including the appropriate family members to test, the process of testing, insurance coverage and turn-around-time for results. We discussed the implications of a negative, positive and/or variant of uncertain significant result. We recommended Rhonda Mccann pursue genetic testing for the multi cancer gene panel. The Multi-Gene Panel offered by [facility] includes sequencing and/or  deletion duplication testing of the following 84 genes: AIP, ALK, APC, ATM, AXIN2,BAP1,  BARD1, BLM, BMPR1A, BRCA1, BRCA2, BRIP1, CASR, CDC73, CDH1, CDK4, CDKN1B, CDKN1C, CDKN2A (p14ARF), CDKN2A (p16INK4a), CEBPA, CHEK2, CTNNA1, DICER1, DIS3L2, EGFR (c.2369C>T, p.Thr790Met variant only), EPCAM (Deletion/duplication testing only), FH, FLCN, GATA2, GPC3, GREM1 (Promoter region deletion/duplication testing only), HOXB13 (c.251G>A, p.Gly84Glu), HRAS, KIT, MAX, MEN1, MET, MITF (c.952G>A, p.Glu318Lys variant only), MLH1, MSH2, MSH3, MSH6, MUTYH, NBN, NF1, NF2, NTHL1, PALB2, PDGFRA, PHOX2B, PMS2, POLD1, POLE, POT1, PRKAR1A, PTCH1, PTEN, RAD50, RAD51C, RAD51D, RB1, RECQL4, RET, RUNX1, SDHAF2, SDHA (sequence changes only), SDHB, SDHC, SDHD, SMAD4, SMARCA4, SMARCB1, SMARCE1, STK11, SUFU, TERC, TERT, TMEM127, TP53, TSC1, TSC2, VHL, WRN and WT1.    Based on  Rhonda Mccann's personal and family history of cancer, she meets medical criteria for genetic testing. Despite that she meets criteria, she may still have an out of pocket cost. We discussed that if her out of pocket cost for testing is over $100, the laboratory will call and confirm whether she wants to proceed with testing. If her OOP cost is higher than $250, the lab can cap her costs at $250. If the out of pocket cost of testing is less than $100 she will be billed by the genetic testing laboratory.   PLAN: After considering the risks, benefits, and limitations, Rhonda Mccann  provided informed consent to pursue genetic testing and the blood sample was sent to [facility] for analysis of the multi-cancer panel. Results should be available within approximately 2-3 weeks' time, at which point they will be disclosed by telephone to Rhonda Mccann, as will any additional recommendations warranted by these results. Rhonda Mccann will receive a summary of her genetic counseling visit and a copy of her results once available. This information will also be available in Epic. We  encouraged Rhonda Mccann to remain in contact with cancer genetics annually so that we can continuously update the family history and inform her of any changes in cancer genetics and testing that may be of benefit for her family. Rhonda Mccann questions were answered to her satisfaction today. Our contact information was provided should additional questions or concerns arise.  Lastly, we encouraged Rhonda Mccann to remain in contact with cancer genetics annually so that we can continuously update the family history and inform her of any changes in cancer genetics and testing that may be of benefit for this family.   Ms.  Mccann questions were answered to her satisfaction today. Our contact information was provided should additional questions or concerns arise. Thank you for the referral and allowing [place] to share in the care of your patient.   Maysa Lynn P. Florene Glen, [place], [facility] Certified Genetic Counselor [email or web address] phone: [phone]  The patient was seen for a total of 45 minutes in face-to-face genetic counseling.  This patient was discussed with Drs. Magrinat, Lindi Adie and/or Burr Medico who agrees with the above.    _______________________________________________________________________ For Office Staff:  Number of people involved in session: 1 Was an Intern/ student involved with case: no

## 2017-12-15 ENCOUNTER — Encounter: Payer: Self-pay | Admitting: Genetic Counselor

## 2017-12-15 ENCOUNTER — Telehealth: Payer: Self-pay | Admitting: Genetic Counselor

## 2017-12-15 DIAGNOSIS — Z1379 Encounter for other screening for genetic and chromosomal anomalies: Secondary | ICD-10-CM | POA: Insufficient documentation

## 2017-12-15 NOTE — Telephone Encounter (Signed)
Revealed negative genetic testing.  Discussed that we do not know why she has Hodgkin's LYmphoma or why there is cancer in the family. It could be due to a different gene that we are not testing, or maybe our current technology may not be able to pick something up.  It will be important for her to keep in contact with genetics to keep up with whether additional testing may be needed.

## 2017-12-22 ENCOUNTER — Ambulatory Visit: Payer: Self-pay | Admitting: Genetic Counselor

## 2017-12-22 NOTE — Progress Notes (Signed)
HPI:  Rhonda Mccann was previously seen in the [place] clinic due to a personal and family history of cancer and concerns regarding a hereditary predisposition to cancer. Please refer to our prior cancer genetics clinic note for more information regarding Rhonda Mccann's medical, social and family histories, and our assessment and recommendations, at the time. Rhonda Mccann recent genetic test results were disclosed to her, as were recommendations warranted by these results. These results and recommendations are discussed in more detail below.  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  The Multi-Gene Panel offered by [facility] includes sequencing and/or deletion duplication testing of the following 84 genes: AIP, ALK, APC, ATM, AXIN2,BAP1,  BARD1, BLM, BMPR1A, BRCA1, BRCA2, BRIP1, CASR, CDC73, CDH1, CDK4, CDKN1B, CDKN1C, CDKN2A (p14ARF), CDKN2A (p16INK4a), CEBPA, CHEK2, CTNNA1, DICER1, DIS3L2, EGFR (c.2369C>T, p.Thr790Met variant only), EPCAM (Deletion/duplication testing only), FH, FLCN, GATA2, GPC3, GREM1 (Promoter region deletion/duplication testing only), HOXB13 (c.251G>A, p.Gly84Glu), HRAS, KIT, MAX, MEN1, MET, MITF (c.952G>A, p.Glu318Lys variant only), MLH1, MSH2, MSH3, MSH6, MUTYH, NBN, NF1, NF2, NTHL1, PALB2, PDGFRA, PHOX2B, PMS2, POLD1, POLE, POT1, PRKAR1A, PTCH1, PTEN, RAD50, RAD51C, RAD51D, RB1, RECQL4, RET, RUNX1, SDHAF2, SDHA (sequence changes only), SDHB, SDHC, SDHD, SMAD4, SMARCA4, SMARCB1, SMARCE1, STK11, SUFU, TERC, TERT, TMEM127, TP53, TSC1, TSC2, VHL, WRN and WT1.  The report date is December 11, 2017.     FAMILY HISTORY:  We obtained a detailed, 4-generation family history.  Significant diagnoses are listed below: Family History    Problem Relation Age of Onset  . Hypertension Father   . High Cholesterol Father   . Breast cancer Paternal Aunt 84  . Pancreatic cancer Maternal Grandfather   . Non-Hodgkin's lymphoma Paternal Grandfather   . Breast cancer Other        MGF's sister    The patient has one son who is cancer free, but has schizophrenia.  She has one sister who is cancer free.  Both parents are living.  The patient's mother had three brothers.  Two died in MVA's and one died from blunt trauma from the head.  The grandmother is deceased from non cancer related issues and the grandfather had pancreatic cancer.  His sister died of breast cancer.  The patient's father had one sister who had breast cancer at 18.  His parents are deceased.The grandfather had NHL lymphoma.  Rhonda Mccann is unaware of previous family history of genetic testing for hereditary cancer risks. Patient's maternal ancestors are of [place] and [place] descent, and paternal ancestors are of Caucasian descent. There is no reported Ashkenazi Jewish ancestry. There is no known consanguinity.  GENETIC TEST RESULTS: Genetic testing reported out on December 11, 2017 through the multi-cancer panel found no deleterious mutations.  The Multi-Gene Panel offered by [facility] includes sequencing and/or deletion duplication testing of the following 84 genes: AIP, ALK, APC, ATM, AXIN2,BAP1,  BARD1, BLM, BMPR1A, BRCA1, BRCA2, BRIP1, CASR, CDC73, CDH1, CDK4, CDKN1B, CDKN1C, CDKN2A (p14ARF), CDKN2A (p16INK4a), CEBPA, CHEK2, CTNNA1, DICER1, DIS3L2, EGFR (c.2369C>T, p.Thr790Met variant only), EPCAM (Deletion/duplication testing only), FH, FLCN, GATA2, GPC3, GREM1 (Promoter region deletion/duplication testing only), HOXB13 (c.251G>A, p.Gly84Glu), HRAS, KIT, MAX, MEN1, MET, MITF (c.952G>A, p.Glu318Lys variant only), MLH1, MSH2, MSH3, MSH6, MUTYH, NBN, NF1, NF2, NTHL1, PALB2, PDGFRA, PHOX2B, PMS2, POLD1, POLE, POT1, PRKAR1A, PTCH1, PTEN,  RAD50, RAD51C, RAD51D, RB1, RECQL4,  RET, RUNX1, SDHAF2, SDHA (sequence changes only), SDHB, SDHC, SDHD, SMAD4, SMARCA4, SMARCB1, SMARCE1, STK11, SUFU, TERC, TERT, TMEM127, TP53, TSC1, TSC2, VHL, WRN and WT1.   The test report has been scanned into EPIC and is located under the Molecular Pathology section of the Results Review tab.    We discussed with Rhonda Mccann that since the current genetic testing is not perfect, it is possible there may be a gene mutation in one of these genes that current testing cannot detect, but that chance is small.  We also discussed, that it is possible that another gene that has not yet been discovered, or that we have not yet tested, is responsible for the cancer diagnoses in the family, and it is, therefore, important to remain in touch with cancer genetics in the future so that we can continue to offer Rhonda Mccann the most up to date genetic testing.    CANCER SCREENING RECOMMENDATIONS: This result is reassuring and indicates that Rhonda Mccann likely does not have an increased risk for a future cancer due to a mutation in one of these genes. This normal test also suggests that Rhonda Mccann's cancer was most likely not due to an inherited predisposition associated with one of these genes.  Most cancers happen by chance and this negative test suggests that her cancer falls into this category.  We, therefore, recommended she continue to follow the cancer management and screening guidelines provided by her oncology and primary healthcare provider.   An individual's cancer risk and medical management are not determined by genetic test results alone. Overall cancer risk assessment incorporates additional factors, including personal medical history, family history, and any available genetic information that may result in a personalized plan for cancer prevention and surveillance.  RECOMMENDATIONS FOR FAMILY MEMBERS:  Women in this family might be at some increased risk of developing cancer, over the general population  risk, simply due to the family history of cancer.  We recommended women in this family have a yearly mammogram beginning at age 80, or 56 years younger than the earliest onset of cancer, an annual clinical breast exam, and perform monthly breast self-exams. Women in this family should also have a gynecological exam as recommended by their primary provider. All family members should have a colonoscopy by age 70.  FOLLOW-UP: Lastly, we discussed with Rhonda Mccann that cancer genetics is a rapidly advancing field and it is possible that new genetic tests will be appropriate for her and/or her family members in the future. We encouraged her to remain in contact with cancer genetics on an annual basis so we can update her personal and family histories and let her know of advances in cancer genetics that may benefit this family.   Our contact number was provided. Rhonda Mccann questions were answered to her satisfaction, and she knows she is welcome to call us at anytime with additional questions or concerns.   Roma Kayser, MS, [facility] Certified Genetic Counselor [email or web address]

## 2018-01-05 ENCOUNTER — Other Ambulatory Visit: Payer: Self-pay | Admitting: Hematology

## 2018-01-19 ENCOUNTER — Telehealth: Payer: Self-pay | Admitting: Hematology

## 2018-01-19 NOTE — Telephone Encounter (Signed)
Patient called to reschedule  °

## 2018-02-12 ENCOUNTER — Other Ambulatory Visit: Payer: 59

## 2018-02-12 ENCOUNTER — Ambulatory Visit: Payer: 59 | Admitting: Hematology

## 2018-02-15 ENCOUNTER — Other Ambulatory Visit: Payer: 59

## 2018-02-15 ENCOUNTER — Ambulatory Visit: Payer: 59 | Admitting: Hematology

## 2018-02-19 NOTE — Progress Notes (Signed)
Marland Kitchen    HEMATOLOGY/ONCOLOGY CLINIC NOTE  Date of Service: 02/22/18     Patient Care Team: Donald Prose, MD as PCP - General (Family Medicine)  CHIEF COMPLAINTS/PURPOSE OF CONSULTATION:  F/u for management of Hodgkins lymphoma  HISTORY OF PRESENTING ILLNESS:   Rhonda Mccann is a wonderful 48 y.o. female who has been referred to [place] by Dr .Donald Prose, MD for evaluation and management of newly diagnosed Hodgkins Lymphoma.  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  Patient first reported noticing a lump in the left side of her neck in January 2018. She noted some mild progressive increase in this lump which appeared more prominent and she reported it to her primary care physician. Patient had an ultrasound of her neck on 07/07/2016 which showed a 1.9 x 0.8 x 1.4 cm lymph node with no distinct fatty hilum. This was subsequently biopsied under ultrasound guidance on 07/21/2016 and findings are consistent with classical Hodgkin's lymphoma nodular sclerosis subtype.  Patient notes no other overtly palpable lymphadenopathy noted. No fevers no chills no night sweats no unexpected weight loss. She reports no lethargy no anorexia. Overall feels quite well.  INTERVAL HISTORY  Ms. Rhonda Mccann presents for follow-up after completion of her planned 6 cycles of chemotherapy for Classical Hodgkin's Lymphoma. The patient's last visit with [place] was on 11/12/17. The pt reports that she is doing well overall.   The pt reports that she recently flew to the [place], and developed bilateral ankle swelling in this context for 1-2 days. Other than this, the pt denies developing any new concerns in the interim. She denies any constitutional symptoms, leg swelling, and calf pain.The pt continues eating well and staying hydrated. She also continues to remain active.   She continues on 60mg   Cymbalta, although she takes this as needed, and notes that the tingling and numbness in her feet continue as well.   Lab results today (02/22/18) of CBC w/diff and CMP is as follows: all values are WNL. 02/22/18 Sed Rate is 10 02/22/18 Vitamin D is pending   On review of systems, pt reports keeping active, good energy levels, persisting neuropathy in feet, moving her bowels well, and denies noticing any new lumps or bumps, fevers, chills, night sweats, unexpected weight loss, abdominal pains, leg swelling, and any other symptoms.  MEDICAL HISTORY:  Past Medical History:  Diagnosis Date  . Depression   . Drug abuse ([place]) 2001  . Family history of breast cancer   . Family history of non-Hodgkin's lymphoma   . Family history of pancreatic cancer   EX alcohol abuse sober for 17 years  Donated left kidney in 2013 (single kidney) Scoliosis GERD  SURGICAL HISTORY: Past Surgical History:  Procedure Laterality Date  . IR FLUORO GUIDE PORT INSERTION RIGHT  08/05/2016  . IR REMOVAL TUN ACCESS W/ PORT W/O FL MOD SED  02/09/2017  . IR US GUIDE VASC ACCESS RIGHT  08/05/2016  . NEPHRECTOMY Left 2015  . NEPHRECTOMY LIVING DONOR  2013  Breast Augmentation Surgery in 1994 (saline implants)  SOCIAL HISTORY: Social History   Socioeconomic History  . Marital status: Married    Spouse name: Not on file  . Number of children: Not on file  . Years of education: Not on file  . Highest education level: Not on file  Occupational History  . Not on file  Social Needs  .  Financial resource strain: Not on file  . Food insecurity:    Worry: Not on file    Inability: Not on file  . Transportation needs:    Medical: Not on file    Non-medical: Not on file  Tobacco Use  . Smoking status: Former Smoker    Types: Cigarettes    Last attempt to quit: 1998    Years since quitting: 22.0  . Smokeless tobacco: Never Used  . Tobacco comment: quit 17 yrs ago  Substance and Sexual Activity  . Alcohol use: No  .  Drug use: No    Comment: Previous drug user - quite 2001  . Sexual activity: Yes  Lifestyle  . Physical activity:    Days per week: Not on file    Minutes per session: Not on file  . Stress: Not on file  Relationships  . Social connections:    Talks on phone: Not on file    Gets together: Not on file    Attends religious service: Not on file    Active member of club or organization: Not on file    Attends meetings of clubs or organizations: Not on file    Relationship status: Not on file  . Intimate partner violence:    Fear of current or ex partner: Not on file    Emotionally abused: Not on file    Physically abused: Not on file    Forced sexual activity: Not on file  Other Topics Concern  . Not on file  Social History Narrative  . Not on file  Works as a Production manager   FAMILY HISTORY: Paternal aunt had breast cancer Paternal grandfather had lymphoma  ALLERGIES:  is allergic to contrast media [iodinated diagnostic agents] and nsaids.  MEDICATIONS:  Current Outpatient Medications  Medication Sig Dispense Refill  . b complex vitamins capsule Take 1 capsule by mouth daily.    . DULoxetine (CYMBALTA) 60 MG capsule TAKE 1 CAPSULE BY MOUTH EVERY DAY 90 capsule 0  . Multiple Vitamin (MULTIVITAMIN) tablet Take 1 tablet by mouth daily.    Marland Kitchen omeprazole (PRILOSEC) 20 MG capsule Take 20 mg by mouth daily.    . Vitamin D, Ergocalciferol, 2000 units CAPS Take 1 capsule by mouth.     No current facility-administered medications for this visit.     REVIEW OF SYSTEMS:    A 10+ POINT REVIEW OF SYSTEMS WAS OBTAINED including neurology, dermatology, psychiatry, cardiac, respiratory, lymph, extremities, GI, GU, Musculoskeletal, constitutional, breasts, reproductive, HEENT.  All pertinent positives are noted in the HPI.  All others are negative.   PHYSICAL EXAMINATION: ECOG PERFORMANCE STATUS: 0 - Asymptomatic  .BP 118/72 (BP Location: Left Arm, Patient Position: Sitting)   Pulse 79    Temp 98.2 F (36.8 C) (Oral)   Resp 18   Ht 5\' 9"  (1.753 m)   Wt 213 lb 8 oz (96.8 kg)   SpO2 99%   BMI 31.53 kg/m   GENERAL:alert, in no acute distress and comfortable SKIN: no acute rashes, no significant lesions EYES: conjunctiva are pink and non-injected, sclera anicteric OROPHARYNX: MMM, no exudates, no oropharyngeal erythema or ulceration NECK: supple, no JVD LYMPH:  no palpable lymphadenopathy in the cervical, axillary or inguinal regions LUNGS: clear to auscultation b/l with normal respiratory effort HEART: regular rate & rhythm ABDOMEN:  normoactive bowel sounds , non tender, not distended. No palpable hepatosplenomegaly.  Extremity: no pedal edema PSYCH: alert & oriented x 3 with fluent speech NEURO: no focal motor/sensory  deficits   LABORATORY DATA:  I have reviewed the data as listed  . CBC Latest Ref Rng & Units 02/22/2018 11/12/2017 08/13/2017  WBC 4.0 - 10.5 K/uL 8.6 8.9 6.4  Hemoglobin 12.0 - 15.0 g/dL 13.6 13.4 12.9  Hematocrit 36.0 - 46.0 % 39.8 39.5 39.0  Platelets 150 - 400 K/uL 287 247 257  HGB 12.7 ANC 3800 . CMP Latest Ref Rng & Units 02/22/2018 11/12/2017 08/13/2017  Glucose 70 - 99 mg/dL 92 95 90  BUN 6 - 20 mg/dL 8 10 10   Creatinine 0.44 - 1.00 mg/dL 0.90 1.06(H) 0.97  Sodium 135 - 145 mmol/L 140 140 138  Potassium 3.5 - 5.1 mmol/L 3.9 4.1 3.9  Chloride 98 - 111 mmol/L 106 108 106  CO2 22 - 32 mmol/L 26 23 25   Calcium 8.9 - 10.3 mg/dL 9.3 9.8 9.1  Total Protein 6.5 - 8.1 g/dL 7.3 7.4 6.9  Total Bilirubin 0.3 - 1.2 mg/dL 0.5 0.4 0.3  Alkaline Phos 38 - 126 U/L 118 126 152(H)  AST 15 - 41 U/L 17 16 19   ALT 0 - 44 U/L 29 19 24          RADIOGRAPHIC STUDIES: I have personally reviewed the radiological images as listed and agreed with the findings in the report. No results found.  ASSESSMENT & PLAN:   48 y.o. very pleasant caucasian female with   1) Stage IIA (adverse risk due to >3 regions of involvement, non bulky) Classical Hodgkins  Lymphoma -Nodular Sclerosis Subtype No constitutional symptoms. Sed rate 18 (WNL) PET/CT reviewed ECHO EF 60-65% DLCO uncorrected 70% of predicted. 24h creatinine clearance WNL.  Patient is s/p 6 cycles of rx -PET from 10/07/2016 reviewed. Near complete metabolic response to therapy and no residual disease > Deauville 2  -PET from 01/28/2017 reviewed. Response to therapy and no residual disease- in remission at this time  2) Insomnia - she has trouble sleeping some nights -trazodone prn   3) Grade 1 neuropathy from ctx. Will monitor on treatment. Mostly controlled with Cymbalta  4) Single kidney - patient donated left kidney in 2013 -avoid nephrotoxic medications -Creatinine stable  PLAN:  -Discussed pt labwork today, 02/22/18; all blood counts and chemistries remain normal  -02/22/18 Sed rate is wnl at 10. -The pt shows no clinical or lab progression/return of her Hodgkin's lymphoma at this time.  -No indication for further treatment at this time.   -continue Cymbalta at current dose. -Recommend sports compression socks with work or flights  -Continue Vitamin B complex and Vitamin D replacement -Pt received flu vaccine in clinic on 11/12/17 -Previously ordered Bone density testing -pending. -Will see the pt back in 4 months    RTC with Dr Irene Limbo with labs in 4 months   All of the patients questions were answered with apparent satisfaction. The patient knows to call the clinic with any problems, questions or concerns.   The total time spent in the appt was 25 minutes and more than 50% was on counseling and direct patient cares.   Sullivan Lone MD [place] AAHIVMS [facility] Hematology/Oncology Physician [facility]  (Office):       [phone] (Work cell):  [phone] (Fax):           [phone]  I, Baldwin Jamaica, am acting as a scribe for Dr. Sullivan Lone.   .I have reviewed the above documentation for accuracy and completeness, and I agree with the  above. Brunetta Genera MD

## 2018-02-21 ENCOUNTER — Other Ambulatory Visit: Payer: Self-pay | Admitting: Hematology

## 2018-02-22 ENCOUNTER — Inpatient Hospital Stay: Payer: 59 | Attending: Hematology

## 2018-02-22 ENCOUNTER — Inpatient Hospital Stay (HOSPITAL_BASED_OUTPATIENT_CLINIC_OR_DEPARTMENT_OTHER): Payer: 59 | Admitting: Hematology

## 2018-02-22 VITALS — BP 118/72 | HR 79 | Temp 98.2°F | Resp 18 | Ht 69.0 in | Wt 213.5 lb

## 2018-02-22 DIAGNOSIS — Z79899 Other long term (current) drug therapy: Secondary | ICD-10-CM | POA: Diagnosis not present

## 2018-02-22 DIAGNOSIS — C8148 Lymphocyte-rich classical Hodgkin lymphoma, lymph nodes of multiple sites: Secondary | ICD-10-CM

## 2018-02-22 DIAGNOSIS — F329 Major depressive disorder, single episode, unspecified: Secondary | ICD-10-CM

## 2018-02-22 DIAGNOSIS — Z8571 Personal history of Hodgkin lymphoma: Secondary | ICD-10-CM | POA: Insufficient documentation

## 2018-02-22 DIAGNOSIS — C8110 Nodular sclerosis classical Hodgkin lymphoma, unspecified site: Secondary | ICD-10-CM

## 2018-02-22 DIAGNOSIS — G629 Polyneuropathy, unspecified: Secondary | ICD-10-CM | POA: Diagnosis not present

## 2018-02-22 DIAGNOSIS — K219 Gastro-esophageal reflux disease without esophagitis: Secondary | ICD-10-CM | POA: Diagnosis not present

## 2018-02-22 DIAGNOSIS — Z87891 Personal history of nicotine dependence: Secondary | ICD-10-CM | POA: Insufficient documentation

## 2018-02-22 LAB — CMP (CANCER CENTER ONLY)
ALT: 29 U/L (ref 0–44)
AST: 17 U/L (ref 15–41)
Albumin: 3.8 g/dL (ref 3.5–5.0)
Alkaline Phosphatase: 118 U/L (ref 38–126)
Anion gap: 8 (ref 5–15)
BUN: 8 mg/dL (ref 6–20)
CO2: 26 mmol/L (ref 22–32)
CREATININE: 0.9 mg/dL (ref 0.44–1.00)
Calcium: 9.3 mg/dL (ref 8.9–10.3)
Chloride: 106 mmol/L (ref 98–111)
GFR, Est AFR Am: >60 mL/min (ref >60)
GFR, Estimated: >60 mL/min (ref >60)
Glucose, Bld: 92 mg/dL (ref 70–99)
Potassium: 3.9 mmol/L (ref 3.5–5.1)
SODIUM: 140 mmol/L (ref 135–145)
Total Bilirubin: 0.5 mg/dL (ref 0.3–1.2)
Total Protein: 7.3 g/dL (ref 6.5–8.1)

## 2018-02-22 LAB — CBC WITH DIFFERENTIAL/PLATELET
Abs Immature Granulocytes: 0.04 10*3/uL (ref 0.00–0.07)
Basophils Absolute: 0 10*3/uL (ref 0.0–0.1)
Basophils Relative: 0 %
Eosinophils Absolute: 0.1 10*3/uL (ref 0.0–0.5)
Eosinophils Relative: 1 %
HCT: 39.8 % (ref 36.0–46.0)
Hemoglobin: 13.6 g/dL (ref 12.0–15.0)
Immature Granulocytes: 1 %
Lymphocytes Relative: 33 %
Lymphs Abs: 2.9 10*3/uL (ref 0.7–4.0)
MCH: 32.4 pg (ref 26.0–34.0)
MCHC: 34.2 g/dL (ref 30.0–36.0)
MCV: 94.8 fL (ref 80.0–100.0)
Monocytes Absolute: 0.3 10*3/uL (ref 0.1–1.0)
Monocytes Relative: 4 %
Neutro Abs: 5.2 10*3/uL (ref 1.7–7.7)
Neutrophils Relative %: 61 %
Platelets: 287 10*3/uL (ref 150–400)
RBC: 4.2 MIL/uL (ref 3.87–5.11)
RDW: 12.6 % (ref 11.5–15.5)
WBC: 8.6 10*3/uL (ref 4.0–10.5)
nRBC: 0 % (ref 0.0–0.2)

## 2018-02-22 LAB — SEDIMENTATION RATE: Sed Rate: 10 mm/hr (ref 0–22)

## 2018-02-23 ENCOUNTER — Other Ambulatory Visit: Payer: Self-pay | Admitting: *Deleted

## 2018-02-23 DIAGNOSIS — C8110 Nodular sclerosis classical Hodgkin lymphoma, unspecified site: Secondary | ICD-10-CM

## 2018-02-23 LAB — VITAMIN D 25 HYDROXY (VIT D DEFICIENCY, FRACTURES): Vit D, 25-Hydroxy: 29 ng/mL — ABNORMAL LOW (ref 30.0–100.0)

## 2018-03-02 ENCOUNTER — Telehealth: Payer: Self-pay

## 2018-03-02 NOTE — Telephone Encounter (Signed)
Spoke with patient concerning her upcoming appointment that was scheduled by (A.S.). per 1/20 los

## 2018-03-02 NOTE — Telephone Encounter (Signed)
Patient has MyChart to view these added appointments.

## 2018-03-18 DIAGNOSIS — R300 Dysuria: Secondary | ICD-10-CM | POA: Diagnosis not present

## 2018-03-18 DIAGNOSIS — N390 Urinary tract infection, site not specified: Secondary | ICD-10-CM | POA: Diagnosis not present

## 2018-05-16 ENCOUNTER — Other Ambulatory Visit: Payer: Self-pay | Admitting: Hematology

## 2018-05-25 DIAGNOSIS — M25551 Pain in right hip: Secondary | ICD-10-CM | POA: Diagnosis not present

## 2018-05-28 ENCOUNTER — Ambulatory Visit
Admission: RE | Admit: 2018-05-28 | Discharge: 2018-05-28 | Disposition: A | Discharge status: Home / Self Care | Payer: 59 | Source: Ambulatory Visit | Attending: Family Medicine | Admitting: Family Medicine

## 2018-05-28 ENCOUNTER — Other Ambulatory Visit: Payer: Self-pay | Admitting: Family Medicine

## 2018-05-28 ENCOUNTER — Other Ambulatory Visit: Payer: Self-pay

## 2018-05-28 DIAGNOSIS — M25551 Pain in right hip: Secondary | ICD-10-CM | POA: Diagnosis not present

## 2018-06-16 ENCOUNTER — Encounter: Payer: Self-pay | Admitting: Hematology

## 2018-06-17 ENCOUNTER — Telehealth: Payer: Self-pay | Admitting: *Deleted

## 2018-06-17 NOTE — Telephone Encounter (Signed)
Correction: Upcoming appt on 5/21 not 5/20

## 2018-06-17 NOTE — Telephone Encounter (Signed)
Contacted patient in response to MyChart question regarding appt on 5/20. Per Dr.Kale - patient can have phone visit if no concerns. Will need to have lab work done either at [facility] [place] or PCP prior to appt.  Patient thanked caller and states she will come to her lab and MD appt on 5/20

## 2018-06-23 NOTE — Progress Notes (Signed)
Marland Kitchen    HEMATOLOGY/ONCOLOGY CLINIC NOTE  Date of Service: 06/24/18     Patient Care Team: Donald Prose, MD as PCP - General (Family Medicine)  CHIEF COMPLAINTS/PURPOSE OF CONSULTATION:  F/u for management of Hodgkins lymphoma  HISTORY OF PRESENTING ILLNESS:   Rhonda Mccann is a wonderful 48 y.o. female who has been referred to [place] by Dr .Donald Prose, MD for evaluation and management of newly diagnosed Hodgkins Lymphoma.  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  Patient first reported noticing a lump in the left side of her neck in January 2018. She noted some mild progressive increase in this lump which appeared more prominent and she reported it to her primary care physician. Patient had an ultrasound of her neck on 07/07/2016 which showed a 1.9 x 0.8 x 1.4 cm lymph node with no distinct fatty hilum. This was subsequently biopsied under ultrasound guidance on 07/21/2016 and findings are consistent with classical Hodgkin's lymphoma nodular sclerosis subtype.  Patient notes no other overtly palpable lymphadenopathy noted. No fevers no chills no night sweats no unexpected weight loss. She reports no lethargy no anorexia. Overall feels quite well.  INTERVAL HISTORY  Ms. Rhonda Mccann presents for follow-up after completion of her planned 6 cycles of chemotherapy for Classical Hodgkin's Lymphoma. The patient's last visit with [place] was on 02/22/18. The pt reports that she is doing well overall.  The pt reports that she has continued to have tingling and numbness in her forefeet, more on the right than the left. Denies pain. Despite this, the pt notes that she would like to decrease her dose of Cymbalta. The pt notes that she has been enjoying a good mood recently and denies any recent concerns for depression. She notes that she would like to lose some weight with diet  and exercise.  She notes that she has been having joint/hip pains which she associates with some increased activity and yard work. She notes that she was walking to stairs and felt her hip give. She had a 05/28/18 Hip XR which revealed mild degenerative changes and no other abnormalities. She notes that she has increased her Vitamin D replacement to 5000 units. The pt has not had compression fractures in the past. She notes that she is continuing to have some periods which are becoming more infrequent- every few months.   She denies noticing any new lumps or bumps.  Lab results today (06/24/18) of CBC w/diff is as follows: all values are WNL. 06/24/18 Sed Rate and CMP are pending  On review of systems, pt reports infrequent periods, numbness in her toes, left knee pain, right hip pain, and denies abdominal pains, leg swelling, fevers, chills, night sweats, new lumps or bumps, and any other symptoms.  MEDICAL HISTORY:  Past Medical History:  Diagnosis Date  . Depression   . Drug abuse ([place]) 2001  . Family history of breast cancer   . Family history of non-Hodgkin's lymphoma   . Family history of pancreatic cancer   EX alcohol abuse sober for 17 years  Donated left kidney in 2013 (single kidney) Scoliosis GERD  SURGICAL HISTORY: Past Surgical History:  Procedure Laterality Date  . IR FLUORO GUIDE PORT INSERTION RIGHT  08/05/2016  . IR REMOVAL TUN ACCESS W/ PORT W/O FL MOD SED  02/09/2017  . IR US GUIDE VASC ACCESS RIGHT  08/05/2016  . NEPHRECTOMY Left  2015  . NEPHRECTOMY LIVING DONOR  2013  Breast Augmentation Surgery in 1994 (saline implants)  SOCIAL HISTORY: Social History   Socioeconomic History  . Marital status: Married    Spouse name: Not on file  . Number of children: Not on file  . Years of education: Not on file  . Highest education level: Not on file  Occupational History  . Not on file  Social Needs  . Financial resource strain: Not on file  . Food insecurity:     Worry: Not on file    Inability: Not on file  . Transportation needs:    Medical: Not on file    Non-medical: Not on file  Tobacco Use  . Smoking status: Former Smoker    Types: Cigarettes    Last attempt to quit: 1998    Years since quitting: 22.4  . Smokeless tobacco: Never Used  . Tobacco comment: quit 17 yrs ago  Substance and Sexual Activity  . Alcohol use: No  . Drug use: No    Comment: Previous drug user - quite 2001  . Sexual activity: Yes  Lifestyle  . Physical activity:    Days per week: Not on file    Minutes per session: Not on file  . Stress: Not on file  Relationships  . Social connections:    Talks on phone: Not on file    Gets together: Not on file    Attends religious service: Not on file    Active member of club or organization: Not on file    Attends meetings of clubs or organizations: Not on file    Relationship status: Not on file  . Intimate partner violence:    Fear of current or ex partner: Not on file    Emotionally abused: Not on file    Physically abused: Not on file    Forced sexual activity: Not on file  Other Topics Concern  . Not on file  Social History Narrative  . Not on file  Works as a Production manager   FAMILY HISTORY: Paternal aunt had breast cancer Paternal grandfather had lymphoma  ALLERGIES:  is allergic to contrast media [iodinated diagnostic agents] and nsaids.  MEDICATIONS:  Current Outpatient Medications  Medication Sig Dispense Refill  . b complex vitamins capsule Take 1 capsule by mouth daily.    . DULoxetine (CYMBALTA) 30 MG capsule Take 1 capsule (30 mg total) by mouth daily. 30 capsule 3  . Multiple Vitamin (MULTIVITAMIN) tablet Take 1 tablet by mouth daily.    Marland Kitchen omeprazole (PRILOSEC) 20 MG capsule Take 20 mg by mouth daily.    . Vitamin D, Ergocalciferol, 2000 units CAPS Take 1 capsule by mouth.     No current facility-administered medications for this visit.     REVIEW OF SYSTEMS:    A 10+ POINT REVIEW OF  SYSTEMS WAS OBTAINED including neurology, dermatology, psychiatry, cardiac, respiratory, lymph, extremities, GI, GU, Musculoskeletal, constitutional, breasts, reproductive, HEENT.  All pertinent positives are noted in the HPI.  All others are negative.   PHYSICAL EXAMINATION: ECOG PERFORMANCE STATUS: 0 - Asymptomatic  .BP 121/74 (BP Location: Left Arm, Patient Position: Sitting)   Pulse 70   Temp 98 F (36.7 C) (Oral)   Resp 17   Ht 5\' 9"  (1.753 m)   Wt 215 lb 9.6 oz (97.8 kg)   SpO2 100%   BMI 31.84 kg/m   GENERAL:alert, in no acute distress and comfortable SKIN: no acute rashes, no significant lesions EYES:  conjunctiva are pink and non-injected, sclera anicteric OROPHARYNX: MMM, no exudates, no oropharyngeal erythema or ulceration NECK: supple, no JVD LYMPH:  no palpable lymphadenopathy in the cervical, axillary or inguinal regions LUNGS: clear to auscultation b/l with normal respiratory effort HEART: regular rate & rhythm ABDOMEN:  normoactive bowel sounds , non tender, not distended. No palpable hepatosplenomegaly.  Extremity: no pedal edema PSYCH: alert & oriented x 3 with fluent speech NEURO: no focal motor/sensory deficits   LABORATORY DATA:  I have reviewed the data as listed  . CBC Latest Ref Rng & Units 06/24/2018 02/22/2018 11/12/2017  WBC 4.0 - 10.5 K/uL 7.4 8.6 8.9  Hemoglobin 12.0 - 15.0 g/dL 13.3 13.6 13.4  Hematocrit 36.0 - 46.0 % 40.0 39.8 39.5  Platelets 150 - 400 K/uL 265 287 247  HGB 12.7 ANC 3800 . CMP Latest Ref Rng & Units 02/22/2018 11/12/2017 08/13/2017  Glucose 70 - 99 mg/dL 92 95 90  BUN 6 - 20 mg/dL 8 10 10   Creatinine 0.44 - 1.00 mg/dL 0.90 1.06(H) 0.97  Sodium 135 - 145 mmol/L 140 140 138  Potassium 3.5 - 5.1 mmol/L 3.9 4.1 3.9  Chloride 98 - 111 mmol/L 106 108 106  CO2 22 - 32 mmol/L 26 23 25   Calcium 8.9 - 10.3 mg/dL 9.3 9.8 9.1  Total Protein 6.5 - 8.1 g/dL 7.3 7.4 6.9  Total Bilirubin 0.3 - 1.2 mg/dL 0.5 0.4 0.3  Alkaline Phos 38 - 126  U/L 118 126 152(H)  AST 15 - 41 U/L 17 16 19   ALT 0 - 44 U/L 29 19 24          RADIOGRAPHIC STUDIES: I have personally reviewed the radiological images as listed and agreed with the findings in the report. Dg Hip Unilat With Pelvis 2-3 Views Right  Result Date: 05/29/2018 CLINICAL DATA:  Right hip pain for 6 months.  No injury. EXAM: DG HIP (WITH OR WITHOUT PELVIS) 2-3V RIGHT COMPARISON:  None. FINDINGS: Mild degenerative changes in the right hip without loss of joint space. No fracture, dislocation, or bony lesion. No other acute abnormalities. IMPRESSION: Mild degenerative changes.  No other abnormalities. Electronically Signed   By: Dorise Bullion III M.D   On: 05/29/2018 04:14    ASSESSMENT & PLAN:   48 y.o. very pleasant caucasian female with   1) Stage IIA (adverse risk due to >3 regions of involvement, non bulky) Classical Hodgkins Lymphoma -Nodular Sclerosis Subtype No constitutional symptoms. Sed rate 18 (WNL) PET/CT reviewed ECHO EF 60-65% DLCO uncorrected 70% of predicted. 24h creatinine clearance WNL.  -PET from 10/07/2016 reviewed. Near complete metabolic response to therapy and no residual disease > Deauville 2  Patient is s/p 6 cycles of rx, completed on 01/07/17  -PET from 01/28/2017 reviewed. Response to therapy and no residual disease- in remission at this time  2) Insomnia - she has trouble sleeping some nights -trazodone prn   3) Grade 1 neuropathy from ctx. Will monitor on treatment. Mostly controlled with Cymbalta  4) Single kidney - patient donated left kidney in 2013 -avoid nephrotoxic medications -Creatinine stable  PLAN:  -Discussed pt labwork today, 06/24/18; blood counts are normal -06/24/18 Sed Rate is 7 -The pt shows no clinical or lab progression/return of her Hodgkin's lymphoma at this time.  -No indication for further treatment at this time.  -Decrease Cymbalta to 30mg   -Recommend sports compression socks with work or flights  -Continue  Vitamin B complex and Vitamin D replacement -Pt received flu vaccine in clinic  on 11/12/17 -Recommended that the pt continue to eat well, drink at least 48-64 oz of water each day, and walk 20-30 minutes each day. -Will see the pt back in 4 months   RTC with dr Irene Limbo with labs in 4 months   All of the patients questions were answered with apparent satisfaction. The patient knows to call the clinic with any problems, questions or concerns.   The total time spent in the appt was 20 minutes and more than 50% was on counseling and direct patient cares.   Sullivan Lone MD [place] AAHIVMS [facility] Hematology/Oncology Physician [facility]  (Office):       [phone] (Work cell):  [phone] (Fax):           [phone]  I, Baldwin [place], am acting as a scribe for Dr. Sullivan Lone.   .I have reviewed the above documentation for accuracy and completeness, and I agree with the above. Brunetta Genera MD

## 2018-06-24 ENCOUNTER — Other Ambulatory Visit: Payer: Self-pay

## 2018-06-24 ENCOUNTER — Inpatient Hospital Stay: Payer: 59 | Attending: Hematology

## 2018-06-24 ENCOUNTER — Inpatient Hospital Stay (HOSPITAL_BASED_OUTPATIENT_CLINIC_OR_DEPARTMENT_OTHER): Payer: 59 | Admitting: Hematology

## 2018-06-24 ENCOUNTER — Telehealth: Payer: Self-pay | Admitting: Hematology

## 2018-06-24 VITALS — BP 121/74 | HR 70 | Temp 98.0°F | Resp 17 | Ht 69.0 in | Wt 215.6 lb

## 2018-06-24 DIAGNOSIS — Z8572 Personal history of non-Hodgkin lymphomas: Secondary | ICD-10-CM

## 2018-06-24 DIAGNOSIS — F329 Major depressive disorder, single episode, unspecified: Secondary | ICD-10-CM | POA: Insufficient documentation

## 2018-06-24 DIAGNOSIS — Z803 Family history of malignant neoplasm of breast: Secondary | ICD-10-CM | POA: Insufficient documentation

## 2018-06-24 DIAGNOSIS — M25551 Pain in right hip: Secondary | ICD-10-CM

## 2018-06-24 DIAGNOSIS — Z79899 Other long term (current) drug therapy: Secondary | ICD-10-CM

## 2018-06-24 DIAGNOSIS — Z800 Family history of malignant neoplasm of digestive organs: Secondary | ICD-10-CM | POA: Diagnosis not present

## 2018-06-24 DIAGNOSIS — Z87891 Personal history of nicotine dependence: Secondary | ICD-10-CM | POA: Diagnosis not present

## 2018-06-24 DIAGNOSIS — Z806 Family history of leukemia: Secondary | ICD-10-CM | POA: Insufficient documentation

## 2018-06-24 DIAGNOSIS — K219 Gastro-esophageal reflux disease without esophagitis: Secondary | ICD-10-CM

## 2018-06-24 DIAGNOSIS — G629 Polyneuropathy, unspecified: Secondary | ICD-10-CM | POA: Diagnosis not present

## 2018-06-24 DIAGNOSIS — G4700 Insomnia, unspecified: Secondary | ICD-10-CM

## 2018-06-24 DIAGNOSIS — F1011 Alcohol abuse, in remission: Secondary | ICD-10-CM | POA: Insufficient documentation

## 2018-06-24 DIAGNOSIS — M25552 Pain in left hip: Secondary | ICD-10-CM | POA: Diagnosis not present

## 2018-06-24 DIAGNOSIS — C8110 Nodular sclerosis classical Hodgkin lymphoma, unspecified site: Secondary | ICD-10-CM

## 2018-06-24 DIAGNOSIS — Z905 Acquired absence of kidney: Secondary | ICD-10-CM | POA: Insufficient documentation

## 2018-06-24 DIAGNOSIS — Z9221 Personal history of antineoplastic chemotherapy: Secondary | ICD-10-CM

## 2018-06-24 LAB — CMP (CANCER CENTER ONLY)
ALT: 30 U/L (ref 0–44)
AST: 19 U/L (ref 15–41)
Albumin: 3.8 g/dL (ref 3.5–5.0)
Alkaline Phosphatase: 88 U/L (ref 38–126)
Anion gap: 8 (ref 5–15)
BUN: 10 mg/dL (ref 6–20)
CO2: 27 mmol/L (ref 22–32)
Calcium: 9.2 mg/dL (ref 8.9–10.3)
Chloride: 105 mmol/L (ref 98–111)
Creatinine: 1 mg/dL (ref 0.44–1.00)
GFR, Est AFR Am: >60 mL/min (ref >60)
GFR, Estimated: >60 mL/min (ref >60)
Glucose, Bld: 108 mg/dL — ABNORMAL HIGH (ref 70–99)
Potassium: 3.9 mmol/L (ref 3.5–5.1)
Sodium: 140 mmol/L (ref 135–145)
Total Bilirubin: 0.3 mg/dL (ref 0.3–1.2)
Total Protein: 7.2 g/dL (ref 6.5–8.1)

## 2018-06-24 LAB — CBC WITH DIFFERENTIAL/PLATELET
Abs Immature Granulocytes: 0.02 10*3/uL (ref 0.00–0.07)
Basophils Absolute: 0 10*3/uL (ref 0.0–0.1)
Basophils Relative: 0 %
Eosinophils Absolute: 0.1 10*3/uL (ref 0.0–0.5)
Eosinophils Relative: 1 %
HCT: 40 % (ref 36.0–46.0)
Hemoglobin: 13.3 g/dL (ref 12.0–15.0)
Immature Granulocytes: 0 %
Lymphocytes Relative: 42 %
Lymphs Abs: 3.1 10*3/uL (ref 0.7–4.0)
MCH: 32.7 pg (ref 26.0–34.0)
MCHC: 33.3 g/dL (ref 30.0–36.0)
MCV: 98.3 fL (ref 80.0–100.0)
Monocytes Absolute: 0.3 10*3/uL (ref 0.1–1.0)
Monocytes Relative: 5 %
Neutro Abs: 3.8 10*3/uL (ref 1.7–7.7)
Neutrophils Relative %: 52 %
Platelets: 265 10*3/uL (ref 150–400)
RBC: 4.07 MIL/uL (ref 3.87–5.11)
RDW: 12.4 % (ref 11.5–15.5)
WBC: 7.4 10*3/uL (ref 4.0–10.5)
nRBC: 0 % (ref 0.0–0.2)

## 2018-06-24 LAB — SEDIMENTATION RATE: Sed Rate: 7 mm/hr (ref 0–22)

## 2018-06-24 MED ORDER — DULOXETINE HCL 30 MG PO CPEP: 30.0000 mg | ORAL_CAPSULE | Freq: Every day | ORAL | 3 refills | Status: DC

## 2018-06-24 NOTE — Telephone Encounter (Signed)
Scheduled appt per 5/21 los. ° °A calendar will be mailed out. °

## 2018-07-08 ENCOUNTER — Other Ambulatory Visit: Payer: Self-pay | Admitting: Hematology

## 2018-07-17 ENCOUNTER — Other Ambulatory Visit: Payer: Self-pay | Admitting: Hematology

## 2018-08-09 ENCOUNTER — Other Ambulatory Visit: Payer: Self-pay | Admitting: Hematology

## 2018-10-21 ENCOUNTER — Telehealth: Payer: Self-pay | Admitting: Hematology

## 2018-10-21 NOTE — Telephone Encounter (Signed)
Returned patient's phone call regarding 09/21 appointment, per patient's request appointment has been cancelled and patient will call back when ready to reschedule.

## 2018-10-25 ENCOUNTER — Inpatient Hospital Stay: Payer: 59 | Admitting: Hematology

## 2018-10-25 ENCOUNTER — Inpatient Hospital Stay: Payer: 59

## 2019-03-05 IMAGING — CT NM PET TUM IMG RESTAG (PS) SKULL BASE T - THIGH
1 of 7 series · 1 of 25 positions shown · non-contrast
Comparison: PET-CT 10/07/2016

CLINICAL DATA: Subsequent treatment strategy for Hodgkin's
lymphoma..

EXAM:
NUCLEAR MEDICINE PET SKULL BASE TO THIGH
TECHNIQUE: 9.9 mCi F-18 FDG was injected intravenously. Full-ring PET imaging
was performed from the skull base to thigh after the radiotracer. CT
data was obtained and used for attenuation correction and anatomic
localization.
FASTING BLOOD GLUCOSE:  Value: 115 mg/dl

[Series 4: ct sk_thigh 5.0 b31f · axial · 5.0mm · 0.98mm/px · 1 of 228 slices shown]
[im 228/228  brain]
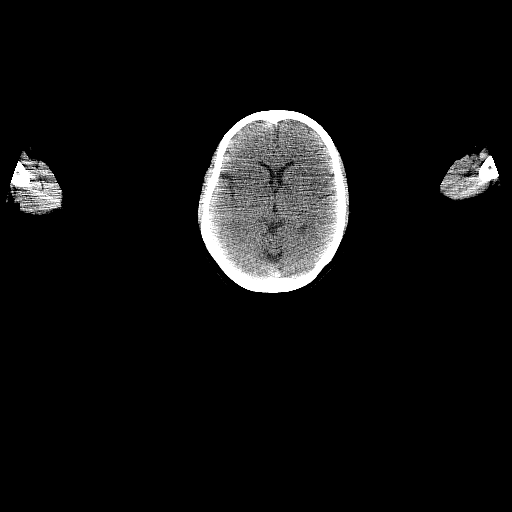

[1 of 25 positions shown; findings below may reference images not displayed]

FINDINGS: NECK

No hypermetabolic lymph nodes in the neck. No enlarged cervical
lymph nodes. physiologic activity at the glottis.

CHEST

No hypermetabolic mediastinal or hilar nodes. No suspicious
pulmonary nodules on the CT scan. Soft tissue thickening in the
anterior mediastinum without metabolic activity at site of prior
enlarged hypermetabolic lymph nodes. The tissue likely represents
residual thymus and small non metabolically active lymph nodes.

ABDOMEN/PELVIS

No abnormal hypermetabolic activity within the liver, pancreas,
adrenal glands, or spleen. No hypermetabolic lymph nodes in the
abdomen or pelvis. Post LEFT nephrectomy. RIGHT kidney appears
normal uterus and ovaries normal.

SKELETON

Diffuse increased marrow metabolic activity consistent GCSF type
response.
IMPRESSION: 1. No evidence of residual metabolic active lymphoma ( [HOSPITAL] 1).
2. Soft tissue thickening in the anterior mediastinum at site of
prior metabolic adenopathy consists with treated adenopathy and
residual thymus.
3. Diffuse marrow metabolic activity consistent with GCSF type
response.

## 2019-07-13 ENCOUNTER — Encounter: Payer: Self-pay | Admitting: Podiatry

## 2019-07-13 ENCOUNTER — Other Ambulatory Visit: Payer: Self-pay

## 2019-07-13 ENCOUNTER — Ambulatory Visit: Payer: 59 | Admitting: Podiatry

## 2019-07-13 DIAGNOSIS — L600 Ingrowing nail: Secondary | ICD-10-CM

## 2019-07-13 DIAGNOSIS — B351 Tinea unguium: Secondary | ICD-10-CM | POA: Diagnosis not present

## 2019-07-13 MED ORDER — NEOMYCIN-POLYMYXIN-HC 3.5-10000-1 OT SOLN: OTIC | 0 refills | Status: DC

## 2019-07-13 NOTE — Addendum Note (Signed)
Addended by: Celene Skeen A on: 07/13/2019 03:38 PM   Modules accepted: Orders

## 2019-07-13 NOTE — Progress Notes (Signed)
Subjective:   Patient ID: Rhonda Mccann, female   DOB: 49 y.o.   MRN: [id number]   HPI Patient presents with discoloration of her hallux nail since chemo 3 years ago and presents with a painful left hallux medial side and the concerns about the thickness of the nailbeds and looseness of the beds.  Patient does not smoke and likes to be active   Review of Systems  All other systems reviewed and are negative.       Objective:  Physical Exam Vitals and nursing note reviewed.  Constitutional:      Appearance: She is well-developed.  Pulmonary:     Effort: Pulmonary effort is normal.  Musculoskeletal:        General: Normal range of motion.  Skin:    General: Skin is warm.  Neurological:     Mental Status: She is alert.     Neurovascular status intact muscle strength was found to be adequate range of motion within normal limits.  Patient is found to have thickened discolored hallux nail left over right with incurvation of the medial border of the left hallux with yellow discoloration noted and mild looseness noted.  Patient has good digital perfusion well oriented x3     Assessment:  Ingrown toenail deformity left hallux along with possibility for mycotic versus traumatic deformity of the nailbeds overall     Plan:  H&P reviewed condition at this point I recommend to remove the medial border of the left hallux and send it off for evaluation for fungus and this hopefully should correct her problem and she does understand ultimately this may require permanent nail surgery with removal of the entire nail bed.  Today I infiltrated 60 mg like Marcaine mixture sterile prep done and using sterile device I remove the medial border I did send off for pathology and fungal culturing and I exposed matrix applied phenol 3 applications 30 seconds followed by alcohol lavage sterile dressing gave instructions to leave dressing on 24 hours but take it off earlier if any issues were to occur and  she is encouraged to call with questions concerns.  We should get the results of fungal cultures within the next 4 weeks

## 2019-07-13 NOTE — Patient Instructions (Signed)

## 2019-08-19 ENCOUNTER — Other Ambulatory Visit: Payer: Self-pay

## 2019-08-19 ENCOUNTER — Ambulatory Visit: Payer: 59 | Admitting: Podiatry

## 2019-08-19 ENCOUNTER — Encounter: Payer: Self-pay | Admitting: Podiatry

## 2019-08-19 DIAGNOSIS — G629 Polyneuropathy, unspecified: Secondary | ICD-10-CM | POA: Diagnosis not present

## 2019-08-19 DIAGNOSIS — L600 Ingrowing nail: Secondary | ICD-10-CM

## 2019-08-19 MED ORDER — GABAPENTIN 300 MG PO CAPS: 300.0000 mg | ORAL_CAPSULE | Freq: Every day | ORAL | 3 refills | Status: DC

## 2019-08-19 NOTE — Progress Notes (Signed)
Subjective:   Patient ID: Rhonda Mccann, female   DOB: 49 y.o.   MRN: [id number]   HPI Patient presents with thickened nailbeds of the big toenails both feet and wants to review pathology and fungal cultures and decide whether or not removal would be best   ROS      Objective:  Physical Exam  Neurovascular status intact with patient found to have dystrophic thickened nailbeds hallux bilateral which have become dormant secondary to trauma that the patient has experienced and chemo     Assessment:  Chronic damage to the hallux nails bilateral with well-healed ingrown toenail site left     Plan:  Cultures discussed with patient and we discussed the fact this is trauma versus fungus and there is no medication we can utilize.  At this point she wants to have nails removed permanently and I do think would be in her best interest and I did explain procedures and risk and she wants surgery and is scheduled to have this done

## 2019-11-14 ENCOUNTER — Ambulatory Visit: Payer: 59 | Admitting: Podiatry

## 2019-11-25 ENCOUNTER — Ambulatory Visit: Payer: 59 | Admitting: Podiatry

## 2020-12-07 ENCOUNTER — Other Ambulatory Visit: Payer: Self-pay

## 2020-12-07 ENCOUNTER — Ambulatory Visit: Payer: 59 | Admitting: Podiatry

## 2020-12-07 DIAGNOSIS — L600 Ingrowing nail: Secondary | ICD-10-CM | POA: Diagnosis not present

## 2020-12-07 MED ORDER — NEOMYCIN-POLYMYXIN-HC 3.5-10000-1 OT SOLN: OTIC | 1 refills | Status: DC

## 2020-12-07 NOTE — Patient Instructions (Signed)

## 2020-12-10 NOTE — Progress Notes (Signed)
Subjective:   Patient ID: Rhonda Mccann, female   DOB: 50 y.o.   MRN: [id number]   HPI Patient presents stating the left big toenail is giving her more issues and she wants to have it removed permanently as it did not respond to previous treatments   ROS      Objective:  Physical Exam  Neurovascular status intact with a thickened dystrophic left hallux nail that is giving her problems and is not growing back properly     Assessment:  Chronic ingrown toenail deformity left hallux with thickness and discomfort     Plan:  H&P reviewed condition recommended nail removal explained procedure risk and patient wants surgery.  I allowed her to read and signed consent form I infiltrated her left hallux 60 mg like Marcaine mixture sterile prep done and using sterile instrumentation remove the nail exposed matrix applied phenol 3 applications 30 seconds followed by alcohol lavage sterile dressing gave instructions on soaks leave dressing on 24 hours but take it off earlier if throbbing were to occur and encouraged her to call questions concerns which may arise

## 2021-02-15 ENCOUNTER — Encounter: Payer: Self-pay | Admitting: Gastroenterology

## 2021-02-21 ENCOUNTER — Other Ambulatory Visit: Payer: Self-pay

## 2021-02-21 ENCOUNTER — Ambulatory Visit (AMBULATORY_SURGERY_CENTER): Payer: 59 | Admitting: *Deleted

## 2021-02-21 VITALS — Ht 70.0 in | Wt 215.0 lb

## 2021-02-21 DIAGNOSIS — Z1211 Encounter for screening for malignant neoplasm of colon: Secondary | ICD-10-CM

## 2021-02-21 MED ORDER — NA SULFATE-K SULFATE-MG SULF 17.5-3.13-1.6 GM/177ML PO SOLN: 1.0000 | ORAL | 0 refills | Status: DC

## 2021-02-21 NOTE — Progress Notes (Signed)

## 2021-02-28 ENCOUNTER — Encounter: Payer: Self-pay | Admitting: Gastroenterology

## 2021-03-07 ENCOUNTER — Encounter: Payer: Self-pay | Admitting: Gastroenterology

## 2021-03-07 ENCOUNTER — Ambulatory Visit (AMBULATORY_SURGERY_CENTER): Payer: 59 | Admitting: Gastroenterology

## 2021-03-07 ENCOUNTER — Other Ambulatory Visit: Payer: Self-pay

## 2021-03-07 VITALS — BP 105/63 | HR 64 | Temp 97.1°F | Resp 10 | Ht 69.0 in | Wt 215.0 lb

## 2021-03-07 DIAGNOSIS — K639 Disease of intestine, unspecified: Secondary | ICD-10-CM | POA: Diagnosis not present

## 2021-03-07 DIAGNOSIS — K621 Rectal polyp: Secondary | ICD-10-CM

## 2021-03-07 DIAGNOSIS — K529 Noninfective gastroenteritis and colitis, unspecified: Secondary | ICD-10-CM | POA: Diagnosis not present

## 2021-03-07 DIAGNOSIS — Z1211 Encounter for screening for malignant neoplasm of colon: Secondary | ICD-10-CM | POA: Diagnosis present

## 2021-03-07 MED ORDER — SODIUM CHLORIDE 0.9 % IV SOLN: 500.0000 mL | Freq: Once | INTRAVENOUS | Status: DC

## 2021-03-07 NOTE — Progress Notes (Signed)
Pt's states no medical or surgical changes since previsit or office visit. 

## 2021-03-07 NOTE — Progress Notes (Signed)
Pt awake, report to RN, [facility]  °

## 2021-03-07 NOTE — Patient Instructions (Signed)
Resume previous diet and medications. Repeat Colonoscopy in 10 years for screening purposes.  YOU HAD AN ENDOSCOPIC PROCEDURE TODAY AT [facility]:   Refer to the procedure report that was given to you for any specific questions about what was found during the examination.  If the procedure report does not answer your questions, please call your gastroenterologist to clarify.  If you requested that your care partner not be given the details of your procedure findings, then the procedure report has been included in a sealed envelope for you to review at your convenience later.  YOU SHOULD EXPECT: Some feelings of bloating in the abdomen. Passage of more gas than usual.  Walking can help get rid of the air that was put into your GI tract during the procedure and reduce the bloating. If you had a lower endoscopy (such as a colonoscopy or flexible sigmoidoscopy) you may notice spotting of blood in your stool or on the toilet paper. If you underwent a bowel prep for your procedure, you may not have a normal bowel movement for a few days.  Please Note:  You might notice some irritation and congestion in your nose or some drainage.  This is from the oxygen used during your procedure.  There is no need for concern and it should clear up in a day or so.  SYMPTOMS TO REPORT IMMEDIATELY:  Following lower endoscopy (colonoscopy or flexible sigmoidoscopy):  Excessive amounts of blood in the stool  Significant tenderness or worsening of abdominal pains  Swelling of the abdomen that is new, acute  Fever of 100F or higher  For urgent or emergent issues, a gastroenterologist can be reached at any hour by calling [phone]. Do not use MyChart messaging for urgent concerns.    DIET:  We do recommend a small meal at first, but then you may proceed to your regular diet.  Drink plenty of fluids but you should avoid alcoholic beverages for 24 hours.  ACTIVITY:  You should plan to take it easy  for the rest of today and you should NOT DRIVE or use heavy machinery until tomorrow (because of the sedation medicines used during the test).    FOLLOW UP: Our staff will call the number listed on your records 48-72 hours following your procedure to check on you and address any questions or concerns that you may have regarding the information given to you following your procedure. If we do not reach you, we will leave a message.  We will attempt to reach you two times.  During this call, we will ask if you have developed any symptoms of COVID 19. If you develop any symptoms (ie: fever, flu-like symptoms, shortness of breath, cough etc.) before then, please call [phone].  If you test positive for Covid 19 in the 2 weeks post procedure, please call and report this information to [place].    If any biopsies were taken you will be contacted by phone or by letter within the next 1-3 weeks.  Please call us at [phone] if you have not heard about the biopsies in 3 weeks.    SIGNATURES/CONFIDENTIALITY: You and/or your care partner have signed paperwork which will be entered into your electronic medical record.  These signatures attest to the fact that that the information above on your After Visit Summary has been reviewed and is understood.  Full responsibility of the confidentiality of this discharge information lies with you and/or your care-partner.

## 2021-03-07 NOTE — Progress Notes (Signed)
Called to room to assist during endoscopic procedure.  Patient ID and intended procedure confirmed with present staff. Received instructions for my participation in the procedure from the performing physician.  

## 2021-03-07 NOTE — Progress Notes (Signed)
D.T. vital signs. °

## 2021-03-07 NOTE — Op Note (Signed)
[facility] Patient Name: Rhonda Mccann Procedure Date: 03/07/2021 8:35 AM MRN: [id number] Endoscopist: [place]. Candis Schatz , MD Age: 51 Referring MD:  Date of Birth: [birth year] Gender: Female Account #: [id number] Procedure:                Colonoscopy Indications:              Screening for colorectal malignant neoplasm, This                            is the patient's first colonoscopy Medicines:                Monitored Anesthesia Care Procedure:                Pre-Anesthesia Assessment:                           - Prior to the procedure, a History and Physical                            was performed, and patient medications and                            allergies were reviewed. The patient's tolerance of                            previous anesthesia was also reviewed. The risks                            and benefits of the procedure and the sedation                            options and risks were discussed with the patient.                            All questions were answered, and informed consent                            was obtained. Prior Anticoagulants: The patient has                            taken no previous anticoagulant or antiplatelet                            agents. ASA Grade Assessment: II - A patient with                            mild systemic disease. After reviewing the risks                            and benefits, the patient was deemed in                            satisfactory condition to undergo the procedure.  After obtaining informed consent, the colonoscope                            was passed under direct vision. Throughout the                            procedure, the patient's blood pressure, pulse, and                            oxygen saturations were monitored continuously. The                            CF [id number] #[id number] was introduced through the anus                            and advanced to the  the terminal ileum, with                            identification of the appendiceal orifice and IC                            valve. The colonoscopy was performed without                            difficulty. The patient tolerated the procedure                            well. The quality of the bowel preparation was                            adequate. The terminal ileum, ileocecal valve,                            appendiceal orifice, and rectum were photographed.                            The bowel preparation used was SUPREP via split                            dose instruction. Scope In: 8:44:37 AM Scope Out: 9:07:35 AM Scope Withdrawal Time: 0 hours 15 minutes 26 seconds  Total Procedure Duration: 0 hours 22 minutes 58 seconds  Findings:                 The perianal and digital rectal examinations were                            normal. Pertinent negatives include normal                            sphincter tone and no palpable rectal lesions.                           A localized area of nodular mucosa was found at the  appendiceal orifice. Biopsies were taken with a                            cold forceps for histology. Estimated blood loss                            was minimal.                           A diffuse area of mildly nodular mucosa was found                            in the rectum. Biopsies were taken with a cold                            forceps for histology. Estimated blood loss was                            minimal.                           A few small-mouthed diverticula were found in the                            descending colon.                           The exam was otherwise normal throughout the                            examined colon.                           The terminal ileum appeared normal.                           The retroflexed view of the distal rectum and anal                            verge was normal and  showed no anal or rectal                            abnormalities. Complications:            No immediate complications. Estimated Blood Loss:     Estimated blood loss was minimal. Impression:               - Nodular mucosa at the appendiceal orifice.                            Biopsied.                           - Nodular mucosa in the rectum. Biopsied.                           - The mucosal abnormalities in  the appendiceal                            orifice and rectum appeared most consistent with                            prominent lymphoid tissue.                           - Diverticulosis in the descending colon.                           - The examined portion of the ileum was normal.                           - The distal rectum and anal verge are normal on                            retroflexion view. Recommendation:           - Patient has a contact number available for                            emergencies. The signs and symptoms of potential                            delayed complications were discussed with the                            patient. Return to normal activities tomorrow.                            Written discharge instructions were provided to the                            patient.                           - Resume previous diet.                           - Continue present medications.                           - Await pathology results.                           - Repeat colonoscopy in 10 years for screening                            purposes. Rhonda Mccann E. Candis Schatz, MD 03/07/2021 9:15:36 AM This report has been signed electronically.

## 2021-03-07 NOTE — Progress Notes (Signed)
[facility] History and Physical   Primary Care Physician:  Donald Prose, MD   Reason for Procedure:   Colon cancer screening  Plan:    Screening colonoscopy     HPI: Rhonda Mccann is a 51 y.o. female undergoing initial average risk screening colonoscopy.  She has no family history of colon cancer and no chronic GI symptoms other than irregular bowel habits.    Past Medical History:  Diagnosis Date   Cancer [facility])    Depression    Drug abuse ([place]) 2001   Family history of breast cancer    Family history of non-Hodgkin's lymphoma    Family history of pancreatic cancer    Hodgkin lymphoma ([place]) 2018    Past Surgical History:  Procedure Laterality Date   IR FLUORO GUIDE PORT INSERTION RIGHT  08/05/2016   IR REMOVAL TUN ACCESS W/ PORT W/O FL MOD SED  02/09/2017   IR US GUIDE VASC ACCESS RIGHT  08/05/2016   NEPHRECTOMY Left 2015   NEPHRECTOMY LIVING DONOR  2013    Prior to Admission medications   Medication Sig Start Date End Date Taking? Authorizing Provider  buPROPion (WELLBUTRIN XL) 300 MG 24 hr tablet Take 300 mg by mouth every morning. 06/06/19  Yes [provider]  omeprazole (PRILOSEC) 20 MG capsule Take 20 mg by mouth daily.   Yes [provider]  gabapentin (NEURONTIN) 300 MG capsule Take 1 capsule (300 mg total) by mouth at bedtime. 08/19/19   Wallene Huh, DPM    Current Outpatient Medications  Medication Sig Dispense Refill   buPROPion (WELLBUTRIN XL) 300 MG 24 hr tablet Take 300 mg by mouth every morning.     omeprazole (PRILOSEC) 20 MG capsule Take 20 mg by mouth daily.     gabapentin (NEURONTIN) 300 MG capsule Take 1 capsule (300 mg total) by mouth at bedtime. 30 capsule 3   Current Facility-Administered Medications  Medication Dose Route Frequency Provider Last Rate Last Admin   0.9 %  sodium chloride infusion  500 mL Intravenous Once Daryel November, MD        Allergies as of 03/07/2021 - Review Complete 03/07/2021   Allergen Reaction Noted   Contrast media [iodinated contrast media]  08/24/2016   Ibuprofen  03/23/2020   Nsaids  08/31/2016    Family History  Problem Relation Age of Onset   Hypertension Father    High Cholesterol Father    Breast cancer Paternal Aunt 25   Pancreatic cancer Maternal Grandfather    Non-Hodgkin's lymphoma Paternal Grandfather    Breast cancer Other        MGF's sister   Colon cancer Neg Hx    Colon polyps Neg Hx     Social History   Socioeconomic History   Marital status: Married    Spouse name: Not on file   Number of children: Not on file   Years of education: Not on file   Highest education level: Not on file  Occupational History   Not on file  Tobacco Use   Smoking status: Former    Types: Cigarettes    Quit date: 1998    Years since quitting: 25.1   Smokeless tobacco: Never   Tobacco comments:    quit 17 yrs ago  Vaping Use   Vaping Use: Never used  Substance and Sexual Activity   Alcohol use: No   Drug use: No    Comment: Previous drug user - quite 2001   Sexual  activity: Yes  Other Topics Concern   Not on file  Social History Narrative   Not on file   Social Determinants of Health   Financial Resource Strain: Not on file  Food Insecurity: Not on file  Transportation Needs: Not on file  Physical Activity: Not on file  Stress: Not on file  Social Connections: Not on file  Intimate Partner Violence: Not on file    Review of Systems:  All other review of systems negative except as mentioned in the HPI.  Physical Exam: Vital signs BP (!) 141/68    Pulse 82    Temp (!) 97.1 F (36.2 C)    Ht 5\' 9"  (1.753 m)    Wt 215 lb (97.5 kg)    SpO2 97%    BMI 31.75 kg/m   General:   Alert,  Well-developed, well-nourished, pleasant and cooperative in NAD Airway:  Mallampati 2 Lungs:  Clear throughout to auscultation.   Heart:  Regular rate and rhythm; no murmurs, clicks, rubs,  or gallops. Abdomen:  Soft, nontender and nondistended.  Normal bowel sounds.   Neuro/Psych:  Normal mood and affect. A and O x 3   Claudean Leavelle E. Candis Schatz, MD [facility]

## 2021-03-11 ENCOUNTER — Telehealth: Payer: Self-pay | Admitting: *Deleted

## 2021-03-11 NOTE — Telephone Encounter (Signed)
°  Follow up Call-  Call back number 03/07/2021  Post procedure Call Back phone  # [phone]  Permission to leave phone message Yes  Some recent data might be hidden     Patient questions:  Do you have a fever, pain , or abdominal swelling? No. Pain Score  0 *  Have you tolerated food without any problems? Yes.    Have you been able to return to your normal activities? Yes.    Do you have any questions about your discharge instructions: Diet   No. Medications  No. Follow up visit  No.  Do you have questions or concerns about your Care? No.  Actions: * If pain score is 4 or above: No action needed, pain <4.  Have you developed a fever since your procedure? no  2.   Have you had an respiratory symptoms (SOB or cough) since your procedure? no  3.   Have you tested positive for COVID 19 since your procedure no  4.   Have you had any family members/close contacts diagnosed with the COVID 19 since your procedure?  no   If yes to any of these questions please route to Joylene John, RN and Joella Prince, RN

## 2021-03-14 NOTE — Progress Notes (Signed)
Rhonda Mccann,  The biopsies taken during your colonoscopy did not show any concerning findings.  The biopsies of the appendix showed some mild inflammation which I do not believe has any clinical significance.  There was no evidence of lymphoma or dysplasia/precancerous change.  The biopsies in the rectum showed benign lymphoid aggregates as expected. You should repeat a colonoscopy in 10 years for colon cancer screening.

## 2021-04-08 ENCOUNTER — Other Ambulatory Visit: Payer: Self-pay | Admitting: Family Medicine

## 2021-04-08 DIAGNOSIS — R591 Generalized enlarged lymph nodes: Secondary | ICD-10-CM

## 2021-04-09 ENCOUNTER — Ambulatory Visit
Admission: RE | Admit: 2021-04-09 | Discharge: 2021-04-09 | Disposition: A | Discharge status: Home / Self Care | Payer: 59 | Source: Ambulatory Visit | Attending: Family Medicine | Admitting: Family Medicine

## 2021-04-09 ENCOUNTER — Other Ambulatory Visit: Payer: Self-pay

## 2021-04-09 DIAGNOSIS — R591 Generalized enlarged lymph nodes: Secondary | ICD-10-CM

## 2021-12-09 ENCOUNTER — Other Ambulatory Visit (HOSPITAL_COMMUNITY): Payer: Self-pay

## 2021-12-09 MED ORDER — BUPROPION HCL ER (XL) 300 MG PO TB24
300.0000 mg | ORAL_TABLET | Freq: Every morning | ORAL | 2 refills | Status: DC
  Filled 2021-12-09: qty 90, 90d supply, fill #0
  Filled 2022-03-12: qty 90, 90d supply, fill #1

## 2022-04-14 DIAGNOSIS — Z1231 Encounter for screening mammogram for malignant neoplasm of breast: Secondary | ICD-10-CM | POA: Diagnosis not present

## 2022-06-06 ENCOUNTER — Other Ambulatory Visit (HOSPITAL_COMMUNITY): Payer: Self-pay

## 2022-06-09 ENCOUNTER — Other Ambulatory Visit (HOSPITAL_COMMUNITY): Payer: Self-pay

## 2022-06-09 MED ORDER — BUPROPION HCL ER (XL) 300 MG PO TB24
300.0000 mg | ORAL_TABLET | Freq: Every morning | ORAL | 1 refills | Status: AC
  Filled 2022-06-09 – 2022-06-23 (×2): qty 90, 90d supply, fill #0
  Filled 2022-09-15: qty 90, 90d supply, fill #1

## 2022-06-10 ENCOUNTER — Other Ambulatory Visit (HOSPITAL_COMMUNITY): Payer: Self-pay

## 2022-06-20 ENCOUNTER — Other Ambulatory Visit (HOSPITAL_COMMUNITY): Payer: Self-pay

## 2022-06-23 ENCOUNTER — Other Ambulatory Visit (HOSPITAL_COMMUNITY): Payer: Self-pay

## 2022-09-05 ENCOUNTER — Ambulatory Visit (HOSPITAL_COMMUNITY)
Admission: RE | Admit: 2022-09-05 | Discharge: 2022-09-05 | Disposition: A | Discharge status: Home / Self Care | Payer: Commercial Managed Care - PPO | Source: Ambulatory Visit | Attending: Family Medicine | Admitting: Family Medicine

## 2022-09-05 ENCOUNTER — Encounter (HOSPITAL_COMMUNITY): Payer: Self-pay

## 2022-09-05 VITALS — BP 119/79 | HR 86 | Temp 98.3°F | Resp 16 | Ht 69.0 in | Wt 200.0 lb

## 2022-09-05 DIAGNOSIS — J069 Acute upper respiratory infection, unspecified: Secondary | ICD-10-CM | POA: Diagnosis not present

## 2022-09-05 DIAGNOSIS — J029 Acute pharyngitis, unspecified: Secondary | ICD-10-CM | POA: Insufficient documentation

## 2022-09-05 LAB — POCT RAPID STREP A (OFFICE): Rapid Strep A Screen: NEGATIVE

## 2022-09-05 MED ORDER — BENZONATATE 100 MG PO CAPS: 100.0000 mg | ORAL_CAPSULE | Freq: Three times a day (TID) | ORAL | 0 refills | Status: AC | PRN

## 2022-09-05 MED ORDER — PREDNISONE 20 MG PO TABS: 40.0000 mg | ORAL_TABLET | Freq: Every day | ORAL | 0 refills | Status: AC

## 2022-09-05 NOTE — Discharge Instructions (Signed)
Your strep test is negative.  Culture of the throat will be sent, and staff will notify you if that is in turn positive.  Take benzonatate 100 mg, 1 tab every 8 hours as needed for cough.  Take prednisone 20 mg--2 daily for 3 days

## 2022-09-05 NOTE — ED Triage Notes (Signed)
Patient here today with c/o ST X 4 days. She is 16 days post Covid. Patient concerned with Strep. She has been using cough syrup, benzonatate, and throat lozenges with some relief. Her husband has also been sick. He is feeling better. No recent travel.

## 2022-09-05 NOTE — ED Provider Notes (Signed)
[facility]    CSN: [id number] Arrival date & time: 09/05/22  1006      History   Chief Complaint Chief Complaint  Patient presents with   Sore Throat    I am 16 days post Covid. Have had sore throat x 4 days. White patches in throat. Suspect Strep infection. - Entered by patient    HPI Rhonda Mccann is a 52 y.o. female.    Sore Throat  Here for 4-day of sore throat.  No fever with this.  About 16 days ago she was diagnosed with COVID.  At that time she was having fatigue and some shortness of breath and cough.  Most things have improved but she is still coughing.  At times she states she has felt like maybe an inhaler would help her, but she has not ever been diagnosed with asthma  She is allergic to NSAIDs and contrast dye  Past Medical History:  Diagnosis Date   Cancer (HCC)    Depression    Drug abuse (HCC) 2001   Family history of breast cancer    Family history of non-Hodgkin's lymphoma    Family history of pancreatic cancer    Hodgkin lymphoma (HCC) 2018    Patient Active Problem List   Diagnosis Date Noted   Genetic testing 12/15/2017   Family history of pancreatic cancer    Family history of breast cancer    Family history of non-Hodgkin's lymphoma    Port catheter in place 09/30/2016   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      Home Medications    Prior to Admission medications   Medication Sig Start Date End Date Taking? Authorizing Provider  benzonatate (TESSALON) 100 MG capsule Take 1 capsule (100 mg total) by mouth 3 (three) times daily as needed for cough. 09/05/22  Yes  Zenia Resides, MD  buPROPion (WELLBUTRIN XL) 300 MG 24 hr tablet Take 1 tablet (300 mg total) by mouth in the morning. 06/09/22  Yes   predniSONE (DELTASONE) 20 MG tablet Take 2 tablets (40 mg total) by mouth daily with breakfast for 3 days. 09/05/22 09/08/22 Yes Zenia Resides, MD  buPROPion (WELLBUTRIN XL) 300 MG 24 hr tablet Take 300 mg by mouth every morning. 06/06/19   [provider]    Family History Family History  Problem Relation Age of Onset   Hypertension Father    High Cholesterol Father    Breast cancer Paternal Aunt 63   Pancreatic cancer Maternal Grandfather    Non-Hodgkin's lymphoma Paternal Grandfather    Breast cancer Other        MGF's sister   Colon cancer Neg Hx    Colon polyps Neg Hx     Social History Social History   Tobacco Use   Smoking status: Former    Current packs/day: 0.00    Types: Cigarettes    Quit date: 1998    Years since quitting: 26.6   Smokeless tobacco: Never   Tobacco comments:    quit 17 yrs ago  Vaping Use  Vaping status: Never Used  Substance Use Topics   Alcohol use: No   Drug use: No    Comment: Previous drug user - quite 2001     Allergies   Contrast media [iodinated contrast media], Ibuprofen, and Nsaids   Review of Systems Review of Systems   Physical Exam Triage Vital Signs ED Triage Vitals  Encounter Vitals Group     BP 09/05/22 1045 119/79     Systolic BP Percentile --      Diastolic BP Percentile --      Pulse Rate 09/05/22 1045 86     Resp 09/05/22 1045 16     Temp 09/05/22 1045 98.3 F (36.8 C)     Temp Source 09/05/22 1045 Oral     SpO2 09/05/22 1045 95 %     Weight 09/05/22 1045 200 lb (90.7 kg)     Height 09/05/22 1045 5\' 9"  (1.753 m)     Head Circumference --      Peak Flow --      Pain Score 09/05/22 1044 4     Pain Loc --      Pain Education --      Exclude from Growth Chart --    No data found.  Updated Vital Signs BP 119/79 (BP Location: Left Arm)   Pulse 86   Temp  98.3 F (36.8 C) (Oral)   Resp 16   Ht 5\' 9"  (1.753 m)   Wt 90.7 kg   LMP 08/22/2022 (Approximate) Comment: Chemo was in 2018  SpO2 95%   BMI 29.53 kg/m   Visual Acuity Right Eye Distance:   Left Eye Distance:   Bilateral Distance:    Right Eye Near:   Left Eye Near:    Bilateral Near:     Physical Exam Vitals reviewed.  Constitutional:      General: She is not in acute distress.    Appearance: She is not ill-appearing, toxic-appearing or diaphoretic.  HENT:     Right Ear: Tympanic membrane and ear canal normal.     Left Ear: Tympanic membrane and ear canal normal.     Nose: Nose normal.     Mouth/Throat:     Mouth: Mucous membranes are moist.     Comments: There is erythema of the posterior oropharynx and of the tonsils.  The tonsils are not enlarged.  There are a couple of spots of white exudate on both tonsils.  No swelling of the soft palate.  Uvula is in the midline Eyes:     Extraocular Movements: Extraocular movements intact.     Conjunctiva/sclera: Conjunctivae normal.     Pupils: Pupils are equal, round, and reactive to light.  Cardiovascular:     Rate and Rhythm: Normal rate and regular rhythm.     Heart sounds: No murmur heard. Pulmonary:     Effort: No respiratory distress.     Breath sounds: No stridor. No wheezing, rhonchi or rales.  Musculoskeletal:     Cervical back: Neck supple.  Lymphadenopathy:     Cervical: No cervical adenopathy.  Skin:    Capillary Refill: Capillary refill takes less than 2 seconds.     Coloration: Skin is not jaundiced or pale.  Neurological:     General: No focal deficit present.     Mental Status: She is alert and oriented to person, place, and time.  Psychiatric:        Behavior: Behavior normal.      UC Treatments / Results  Labs (all labs ordered are listed, but only abnormal results are displayed) Labs Reviewed  CULTURE, GROUP A STREP [facility])  POCT RAPID STREP A (OFFICE)    EKG   Radiology No results  found.  Procedures Procedures (including critical care time)  Medications Ordered in UC Medications - No data to display  Initial Impression / Assessment and Plan / UC Course  I have reviewed the triage vital signs and the nursing notes.  Pertinent labs & imaging results that were available during my care of the patient were reviewed by me and considered in my medical decision making (see chart for details).        Rapid strep is negative.  Throat culture is sent and we will notify her and treat per protocol if positive.  She is having a staccato cough while she was being examined in the room.  Lungs are clear though breath sounds are little coarse.  3 days of prednisone is sent in for possible bronchitis, and Tessalon Perles are sent in for the cough. Final Clinical Impressions(s) / UC Diagnoses   Final diagnoses:  Sore throat  Viral URI with cough     Discharge Instructions      Your strep test is negative.  Culture of the throat will be sent, and staff will notify you if that is in turn positive.  Take benzonatate 100 mg, 1 tab every 8 hours as needed for cough.  Take prednisone 20 mg--2 daily for 3 days      ED Prescriptions     Medication Sig Dispense Auth. Provider   benzonatate (TESSALON) 100 MG capsule Take 1 capsule (100 mg total) by mouth 3 (three) times daily as needed for cough. 21 capsule Zenia Resides, MD   predniSONE (DELTASONE) 20 MG tablet Take 2 tablets (40 mg total) by mouth daily with breakfast for 3 days. 6 tablet Marlinda Mike Janace Aris, MD      PDMP not reviewed this encounter.   Zenia Resides, MD 09/05/22 1116

## 2022-09-23 DIAGNOSIS — M545 Low back pain, unspecified: Secondary | ICD-10-CM | POA: Diagnosis not present

## 2022-09-23 DIAGNOSIS — C8110 Nodular sclerosis classical Hodgkin lymphoma, unspecified site: Secondary | ICD-10-CM | POA: Diagnosis not present

## 2022-09-23 DIAGNOSIS — Z20828 Contact with and (suspected) exposure to other viral communicable diseases: Secondary | ICD-10-CM | POA: Diagnosis not present

## 2022-09-23 DIAGNOSIS — Z13220 Encounter for screening for lipoid disorders: Secondary | ICD-10-CM | POA: Diagnosis not present

## 2022-09-23 DIAGNOSIS — Z0000 Encounter for general adult medical examination without abnormal findings: Secondary | ICD-10-CM | POA: Diagnosis not present

## 2022-09-23 DIAGNOSIS — F3340 Major depressive disorder, recurrent, in remission, unspecified: Secondary | ICD-10-CM | POA: Diagnosis not present

## 2023-04-20 DIAGNOSIS — Z1231 Encounter for screening mammogram for malignant neoplasm of breast: Secondary | ICD-10-CM | POA: Diagnosis not present

## 2024-02-02 ENCOUNTER — Other Ambulatory Visit (HOSPITAL_BASED_OUTPATIENT_CLINIC_OR_DEPARTMENT_OTHER): Payer: Self-pay
